# Patient Record
Sex: Female | Born: 1990 | Race: Black or African American | Hispanic: No | Marital: Single | Smoking: Never smoker
Health system: Southern US, Community
[De-identification: ages and names within clinical notes are randomized; demographics above are authoritative.]

## PROBLEM LIST (undated history)

## (undated) DIAGNOSIS — F32A Depression, unspecified: Secondary | ICD-10-CM

## (undated) DIAGNOSIS — F419 Anxiety disorder, unspecified: Secondary | ICD-10-CM

## (undated) HISTORY — DX: Depression, unspecified: F32.A

## (undated) HISTORY — PX: WISDOM TOOTH EXTRACTION: SHX21

## (undated) HISTORY — PX: ADENOIDECTOMY: SHX5191

## (undated) HISTORY — DX: Anxiety disorder, unspecified: F41.9

## (undated) HISTORY — PX: TONSILLECTOMY AND ADENOIDECTOMY: SHX28

---
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
## 2018-12-15 ENCOUNTER — Ambulatory Visit (HOSPITAL_COMMUNITY)
Admission: EM | Admit: 2018-12-15 | Discharge: 2018-12-15 | Disposition: A | Discharge status: Home / Self Care | Payer: BLUE CROSS/BLUE SHIELD | Attending: Family Medicine | Admitting: Family Medicine

## 2018-12-15 ENCOUNTER — Other Ambulatory Visit: Payer: Self-pay

## 2018-12-15 ENCOUNTER — Encounter (HOSPITAL_COMMUNITY): Payer: Self-pay | Admitting: Emergency Medicine

## 2018-12-15 DIAGNOSIS — G44209 Tension-type headache, unspecified, not intractable: Secondary | ICD-10-CM | POA: Diagnosis not present

## 2018-12-15 MED ORDER — KETOROLAC TROMETHAMINE 60 MG/2ML IM SOLN
INTRAMUSCULAR | Status: AC
  Filled 2018-12-15: qty 2

## 2018-12-15 MED ORDER — KETOROLAC TROMETHAMINE 60 MG/2ML IM SOLN
60.0000 mg | Freq: Once | INTRAMUSCULAR | Status: AC
  Administered 2018-12-15: 60 mg via INTRAMUSCULAR

## 2018-12-15 NOTE — ED Notes (Signed)
Bed: UC05 Expected date: 12/15/18 Expected time: 4:51 PM Means of arrival:  Comments: PATIENT

## 2018-12-15 NOTE — ED Triage Notes (Signed)
Headache, history of high blood pressure

## 2018-12-15 NOTE — ED Provider Notes (Signed)
[facility]   [id number] 12/15/18 Arrival Time: 1652  ASSESSMENT & PLAN:  1. Tension headache    BP normal here. Reassured. Unlikely headache related to BP.  Normal neurological exam. No indication for neurodiagnostic workup at this time. Discussed.   Meds ordered this encounter  Medications  . ketorolac (TORADOL) injection 60 mg   Follow-up Information    [facility] EMERGENCY DEPARTMENT.   Specialty:  Emergency Medicine Why:  If symptoms worsen. Contact information: [address] [id number] mc [place] [address] [phone]         Reviewed expectations re: course of current medical issues. Questions answered. Outlined signs and symptoms indicating need for more acute intervention. Patient verbalized understanding. After Visit Summary given.   SUBJECTIVE:  AVERLEIGH BIRCH is a 28 y.o. female who presents with complaint of a headache. Reports gradual over the past week; on/off. Location: posterior neck to superior scalp. "Feels tight." History of headaches: reports occasional migraines; this different. Not the worst headache of her life. Ibuprofen with mild relief. Does not wake her at night. Precipitating factors include: none which have been determined. Associated symptoms: Preceding aura: no. Nausea/vomiting: no. Vision changes: no. Increased sensitivity to light and to noises: no. Fever: no. Sinus pressure/congestion: no. Extremity weakness: no. Current headache has not limited normal daily activities. Denies dizziness, loss of balance, muscle weakness, numbness of extremities, speech difficulties and vision problems. No head injury reported. Ambulatory without difficulty.  ROS: As per HPI. All other systems negative.    OBJECTIVE:  Vitals:   12/15/18 1719 12/15/18 1720  BP: 100/68   Pulse: 64   Resp: (!) 1 18  Temp: 98.7 F (37.1 C)   TempSrc: Oral   SpO2: 96%     General appearance: alert;  no distress Eyes: PERRLA; EOMI; conjunctiva normal HENT: normocephalic; atraumatic Neck: supple with FROM Lungs: clear to auscultation bilaterally Heart: regular rate and rhythm Extremities: no edema; symmetrical with no gross deformities Skin: warm and dry Neurologic: CN 2-12 grossly intact; normal gait; normal symmetric reflexes; normal extremity strength and sensation throughout Psychological: alert and cooperative; normal mood and affect  No Known Allergies  PMH: H/O headaches.  Social History   Socioeconomic History  . Marital status: Single    Spouse name: Not on file  . Number of children: Not on file  . Years of education: Not on file  . Highest education level: Not on file  Occupational History  . Not on file  Social Needs  . Financial resource strain: Not on file  . Food insecurity:    Worry: Not on file    Inability: Not on file  . Transportation needs:    Medical: Not on file    Non-medical: Not on file  Tobacco Use  . Smoking status: Never Smoker  Substance and Sexual Activity  . Alcohol use: Never    Frequency: Never  . Drug use: Never  . Sexual activity: Not on file  Lifestyle  . Physical activity:    Days per week: Not on file    Minutes per session: Not on file  . Stress: Not on file  Relationships  . Social connections:    Talks on phone: Not on file    Gets together: Not on file    Attends religious service: Not on file    Active member of club or organization: Not on file    Attends meetings of clubs or organizations: Not on file  Relationship status: Not on file  . Intimate partner violence:    Fear of current or ex partner: Not on file    Emotionally abused: Not on file    Physically abused: Not on file    Forced sexual activity: Not on file  Other Topics Concern  . Not on file  Social History Narrative  . Not on file   FH: Questions h/o migraines.  History reviewed. No pertinent surgical history.   Mardella Layman, MD 12/23/18  [phone]

## 2020-06-15 ENCOUNTER — Ambulatory Visit (HOSPITAL_COMMUNITY)
Admission: EM | Admit: 2020-06-15 | Discharge: 2020-06-15 | Disposition: A | Discharge status: Home / Self Care | Payer: BC Managed Care – PPO | Attending: Internal Medicine | Admitting: Internal Medicine

## 2020-06-15 ENCOUNTER — Encounter (HOSPITAL_COMMUNITY): Payer: Self-pay | Admitting: Emergency Medicine

## 2020-06-15 ENCOUNTER — Other Ambulatory Visit: Payer: Self-pay

## 2020-06-15 DIAGNOSIS — J040 Acute laryngitis: Secondary | ICD-10-CM | POA: Diagnosis not present

## 2020-06-15 DIAGNOSIS — Z20822 Contact with and (suspected) exposure to covid-19: Secondary | ICD-10-CM | POA: Diagnosis not present

## 2020-06-15 MED ORDER — FLUTICASONE PROPIONATE 50 MCG/ACT NA SUSP: 1.0000 | Freq: Every day | NASAL | 0 refills | Status: DC

## 2020-06-15 NOTE — Discharge Instructions (Signed)
If your symptoms worsen please return to urgent care for re-evaluation

## 2020-06-15 NOTE — ED Notes (Signed)
covid sample collected, liquid in vial, lid secured tightly, label placed, placed in lab

## 2020-06-15 NOTE — ED Triage Notes (Signed)
Reports having sore throat, sinus pressure, congestion, nasal congestion-this started on Monday.  Now patient has a cough that developed on Tuesday.  Minimal phlegm with coughing.

## 2020-06-16 LAB — SARS CORONAVIRUS 2 (TAT 6-24 HRS): SARS Coronavirus 2: NEGATIVE

## 2020-06-18 NOTE — ED Provider Notes (Signed)
[facility]    CSN: [id number] Arrival date & time: 06/15/20  1652      History   Chief Complaint Chief Complaint  Patient presents with  . Cough    HPI Brooke Dean is a 29 y.o. female comes to the urgent care with complaints of sinus pressure and congestion which started on Monday patient says symptoms started insidiously and has progressed.  Currently has coughing which is mainly nonproductive.  No fever or chills.  No loss of taste and smell.   No nausea vomiting or diarrhea.  Patient has noticed hoarseness in the voice with no difficulty swallowing.  No choking sensation.  No shortness of breath, wheezing or chest pain.  Patient has some sinus pressure, no headaches  HPI  History reviewed. No pertinent past medical history.  There are no problems to display for this patient.   Past Surgical History:  Procedure Laterality Date  . TONSILLECTOMY AND ADENOIDECTOMY    . WISDOM TOOTH EXTRACTION      OB History   No obstetric history on file.      Home Medications    Prior to Admission medications   Medication Sig Start Date End Date Taking? Authorizing Provider  fluticasone (FLONASE) 50 MCG/ACT nasal spray Place 1 spray into both nostrils daily. 06/15/20   Saylee Sherrill, Britta Mccreedy, MD    Family History No family history on file.  Social History Social History   Tobacco Use  . Smoking status: Never Smoker  Substance Use Topics  . Alcohol use: Yes  . Drug use: Never    Comment: thc gummies     Allergies   Other   Review of Systems Review of Systems  Constitutional: Negative.   HENT: Positive for congestion, postnasal drip, rhinorrhea, sinus pressure and sinus pain. Negative for sore throat.   Gastrointestinal: Negative.   Genitourinary: Negative.   Neurological: Negative.      Physical Exam Triage Vital Signs ED Triage Vitals  Enc Vitals Group     BP 06/15/20 1844 (!) 132/71     Pulse Rate 06/15/20 1844 81     Resp 06/15/20 1844 18      Temp 06/15/20 1844 98.5 F (36.9 C)     Temp Source 06/15/20 1844 Oral     SpO2 06/15/20 1844 96 %     Weight --      Height --      Head Circumference --      Peak Flow --      Pain Score 06/15/20 1840 0     Pain Loc --      Pain Edu? --      Excl. in GC? --    No data found.  Updated Vital Signs BP (!) 132/71 (BP Location: Right Arm)   Pulse 81   Temp 98.5 F (36.9 C) (Oral)   Resp 18   SpO2 96%   Visual Acuity Right Eye Distance:   Left Eye Distance:   Bilateral Distance:    Right Eye Near:   Left Eye Near:    Bilateral Near:     Physical Exam Constitutional:      General: She is not in acute distress.    Appearance: She is not ill-appearing.  Cardiovascular:     Rate and Rhythm: Normal rate and regular rhythm.     Pulses: Normal pulses.     Heart sounds: Normal heart sounds.  Pulmonary:     Effort: Pulmonary effort is normal.  Breath sounds: Normal breath sounds.  Musculoskeletal:        General: Normal range of motion.  Neurological:     Mental Status: She is alert.      UC Treatments / Results  Labs (all labs ordered are listed, but only abnormal results are displayed) Labs Reviewed  SARS CORONAVIRUS 2 (TAT 6-24 HRS)    EKG   Radiology No results found.  Procedures Procedures (including critical care time)  Medications Ordered in UC Medications - No data to display  Initial Impression / Assessment and Plan / UC Course  I have reviewed the triage vital signs and the nursing notes.  Pertinent labs & imaging results that were available during my care of the patient were reviewed by me and considered in my medical decision making (see chart for details).     1.  Acute laryngitis: Supportive care with fluticasone nasal spray Warm salt water gargles Humidifier use Return precautions given  Final Clinical Impressions(s) / UC Diagnoses   Final diagnoses:  Acute laryngitis     Discharge Instructions     If your symptoms  worsen please return to urgent care for re-evaluation   ED Prescriptions    Medication Sig Dispense Auth. Provider   fluticasone (FLONASE) 50 MCG/ACT nasal spray Place 1 spray into both nostrils daily. 16 g Kersten Salmons, Britta Mccreedy, MD     PDMP not reviewed this encounter.   Merrilee Jansky, MD 06/18/20 [phone]

## 2020-07-06 ENCOUNTER — Ambulatory Visit (INDEPENDENT_AMBULATORY_CARE_PROVIDER_SITE_OTHER): Payer: BC Managed Care – PPO

## 2020-07-06 ENCOUNTER — Other Ambulatory Visit: Payer: Self-pay

## 2020-07-06 ENCOUNTER — Ambulatory Visit: Payer: BC Managed Care – PPO | Admitting: Podiatry

## 2020-07-06 ENCOUNTER — Encounter: Payer: Self-pay | Admitting: Podiatry

## 2020-07-06 DIAGNOSIS — S9031XA Contusion of right foot, initial encounter: Secondary | ICD-10-CM

## 2020-07-06 DIAGNOSIS — M7661 Achilles tendinitis, right leg: Secondary | ICD-10-CM

## 2020-07-06 DIAGNOSIS — M2141 Flat foot [pes planus] (acquired), right foot: Secondary | ICD-10-CM

## 2020-07-06 DIAGNOSIS — M2142 Flat foot [pes planus] (acquired), left foot: Secondary | ICD-10-CM | POA: Diagnosis not present

## 2020-07-06 MED ORDER — METHYLPREDNISOLONE 4 MG PO TBPK: ORAL_TABLET | ORAL | 0 refills | Status: DC

## 2020-07-06 NOTE — Patient Instructions (Signed)
If was nice to meet you today. If you have any questions or any further concerns, please feel fee to give me a call. You can call our office at [phone] or please feel fee to send me a message through MyChart.    Achilles Tendinitis  with Rehab Achilles tendinitis is a disorder of the Achilles tendon. The Achilles tendon connects the large calf muscles (Gastrocnemius and Soleus) to the heel bone (calcaneus). This tendon is sometimes called the heel cord. It is important for pushing-off and standing on your toes and is important for walking, running, or jumping. Tendinitis is often caused by overuse and repetitive microtrauma. SYMPTOMS  Pain, tenderness, swelling, warmth, and redness may occur over the Achilles tendon even at rest.  Pain with pushing off, or flexing or extending the ankle.  Pain that is worsened after or during activity. CAUSES   Overuse sometimes seen with rapid increase in exercise programs or in sports requiring running and jumping.  Poor physical conditioning (strength and flexibility or endurance).  Running sports, especially training running down hills.  Inadequate warm-up before practice or play or failure to stretch before participation.  Injury to the tendon. PREVENTION   Warm up and stretch before practice or competition.  Allow time for adequate rest and recovery between practices and competition.  Keep up conditioning.  Keep up ankle and leg flexibility.  Improve or keep muscle strength and endurance.  Improve cardiovascular fitness.  Use proper technique.  Use proper equipment (shoes, skates).  To help prevent recurrence, taping, protective strapping, or an adhesive bandage may be recommended for several weeks after healing is complete. PROGNOSIS   Recovery may take weeks to several months to heal.  Longer recovery is expected if symptoms have been prolonged.  Recovery is usually quicker if the inflammation is due to a direct blow as  compared with overuse or sudden strain. RELATED COMPLICATIONS   Healing time will be prolonged if the condition is not correctly treated. The injury must be given plenty of time to heal.  Symptoms can reoccur if activity is resumed too soon.  Untreated, tendinitis may increase the risk of tendon rupture requiring additional time for recovery and possibly surgery. TREATMENT   The first treatment consists of rest anti-inflammatory medication, and ice to relieve the pain.  Stretching and strengthening exercises after resolution of pain will likely help reduce the risk of recurrence. Referral to a physical therapist or athletic trainer for further evaluation and treatment may be helpful.  A walking boot or cast may be recommended to rest the Achilles tendon. This can help break the cycle of inflammation and microtrauma.  Arch supports (orthotics) may be prescribed or recommended by your caregiver as an adjunct to therapy and rest.  Surgery to remove the inflamed tendon lining or degenerated tendon tissue is rarely necessary and has shown less than predictable results. MEDICATION   Nonsteroidal anti-inflammatory medications, such as aspirin and ibuprofen, may be used for pain and inflammation relief. Do not take within 7 days before surgery. Take these as directed by your caregiver. Contact your caregiver immediately if any bleeding, stomach upset, or signs of allergic reaction occur. Other minor pain relievers, such as acetaminophen, may also be used.  Pain relievers may be prescribed as necessary by your caregiver. Do not take prescription pain medication for longer than 4 to 7 days. Use only as directed and only as much as you need.  Cortisone injections are rarely indicated. Cortisone injections may weaken tendons and predispose  to rupture. It is better to give the condition more time to heal than to use them. HEAT AND COLD  Cold is used to relieve pain and reduce inflammation for acute  and chronic Achilles tendinitis. Cold should be applied for 10 to 15 minutes every 2 to 3 hours for inflammation and pain and immediately after any activity that aggravates your symptoms. Use ice packs or an ice massage.  Heat may be used before performing stretching and strengthening activities prescribed by your caregiver. Use a heat pack or a warm soak. SEEK MEDICAL CARE IF:  Symptoms get worse or do not improve in 2 weeks despite treatment.  New, unexplained symptoms develop. Drugs used in treatment may produce side effects.  EXERCISES:  RANGE OF MOTION (ROM) AND STRETCHING EXERCISES - Achilles Tendinitis  These exercises may help you when beginning to rehabilitate your injury. Your symptoms may resolve with or without further involvement from your physician, physical therapist or athletic trainer. While completing these exercises, remember:   Restoring tissue flexibility helps normal motion to return to the joints. This allows healthier, less painful movement and activity.  An effective stretch should be held for at least 30 seconds.  A stretch should never be painful. You should only feel a gentle lengthening or release in the stretched tissue.  STRETCH  Gastroc, Standing   Place hands on wall.  Extend right / left leg, keeping the front knee somewhat bent.  Slightly point your toes inward on your back foot.  Keeping your right / left heel on the floor and your knee straight, shift your weight toward the wall, not allowing your back to arch.  You should feel a gentle stretch in the right / left calf. Hold this position for 10 seconds. Repeat 3 times. Complete this stretch 2 times per day.  STRETCH  Soleus, Standing   Place hands on wall.  Extend right / left leg, keeping the other knee somewhat bent.  Slightly point your toes inward on your back foot.  Keep your right / left heel on the floor, bend your back knee, and slightly shift your weight over the back leg so that  you feel a gentle stretch deep in your back calf.  Hold this position for 10 seconds. Repeat 3 times. Complete this stretch 2 times per day.  STRETCH  Gastrocsoleus, Standing  Note: This exercise can place a lot of stress on your foot and ankle. Please complete this exercise only if specifically instructed by your caregiver.   Place the ball of your right / left foot on a step, keeping your other foot firmly on the same step.  Hold on to the wall or a rail for balance.  Slowly lift your other foot, allowing your body weight to press your heel down over the edge of the step.  You should feel a stretch in your right / left calf.  Hold this position for 10 seconds.  Repeat this exercise with a slight bend in your knee. Repeat 3 times. Complete this stretch 2 times per day.   STRENGTHENING EXERCISES - Achilles Tendinitis These exercises may help you when beginning to rehabilitate your injury. They may resolve your symptoms with or without further involvement from your physician, physical therapist or athletic trainer. While completing these exercises, remember:   Muscles can gain both the endurance and the strength needed for everyday activities through controlled exercises.  Complete these exercises as instructed by your physician, physical therapist or athletic trainer. Progress the resistance  and repetitions only as guided.  You may experience muscle soreness or fatigue, but the pain or discomfort you are trying to eliminate should never worsen during these exercises. If this pain does worsen, stop and make certain you are following the directions exactly. If the pain is still present after adjustments, discontinue the exercise until you can discuss the trouble with your clinician.  STRENGTH - Plantar-flexors   Sit with your right / left leg extended. Holding onto both ends of a rubber exercise band/tubing, loop it around the ball of your foot. Keep a slight tension in the  band.  Slowly push your toes away from you, pointing them downward.  Hold this position for 10 seconds. Return slowly, controlling the tension in the band/tubing. Repeat 3 times. Complete this exercise 2 times per day.   STRENGTH - Plantar-flexors   Stand with your feet shoulder width apart. Steady yourself with a wall or table using as little support as needed.  Keeping your weight evenly spread over the width of your feet, rise up on your toes.*  Hold this position for 10 seconds. Repeat 3 times. Complete this exercise 2 times per day.  *If this is too easy, shift your weight toward your right / left leg until you feel challenged. Ultimately, you may be asked to do this exercise with your right / left foot only.  STRENGTH  Plantar-flexors, Eccentric  Note: This exercise can place a lot of stress on your foot and ankle. Please complete this exercise only if specifically instructed by your caregiver.   Place the balls of your feet on a step. With your hands, use only enough support from a wall or rail to keep your balance.  Keep your knees straight and rise up on your toes.  Slowly shift your weight entirely to your right / left toes and pick up your opposite foot. Gently and with controlled movement, lower your weight through your right / left foot so that your heel drops below the level of the step. You will feel a slight stretch in the back of your calf at the end position.  Use the healthy leg to help rise up onto the balls of both feet, then lower weight only on the right / left leg again. Build up to 15 repetitions. Then progress to 3 consecutive sets of 15 repetitions.*  After completing the above exercise, complete the same exercise with a slight knee bend (about 30 degrees). Again, build up to 15 repetitions. Then progress to 3 consecutive sets of 15 repetitions.* Perform this exercise 2 times per day.  *When you easily complete 3 sets of 15, your physician, physical therapist  or athletic trainer may advise you to add resistance by wearing a backpack filled with additional weight.  STRENGTH - Plantar Flexors, Seated   Sit on a chair that allows your feet to rest flat on the ground. If necessary, sit at the edge of the chair.  Keeping your toes firmly on the ground, lift your right / left heel as far as you can without increasing any discomfort in your ankle. Repeat 3 times. Complete this exercise 2 times a day.

## 2020-07-10 NOTE — Progress Notes (Signed)
Subjective:   Patient ID: Brooke Dean, female   DOB: 29 y.o.   MRN: [id number]   HPI 29 year old female presents the office today for concerns of Achilles tendon discomfort.  She states that on June 19, 2020 she felt a sharp pain in the back of her leg along the Achilles tendon but she denies any falls or injury.  She does work at [employer] standing on concrete surfaces which may seem to aggravate her symptoms.  She states that people tell her she is walking with a limp.  No specific fall or injury.  No increase in swelling or bruising.  Also is concerned of nail fungus.  No pain in the nails and no recent treatment.   Review of Systems  All other systems reviewed and are negative.  No past medical history on file.  Past Surgical History:  Procedure Laterality Date  . TONSILLECTOMY AND ADENOIDECTOMY    . WISDOM TOOTH EXTRACTION       Current Outpatient Medications:  .  fluticasone (FLONASE) 50 MCG/ACT nasal spray, Place 1 spray into both nostrils daily. (Patient not taking: Reported on 07/06/2020), Disp: 16 g, Rfl: 0 .  methylPREDNISolone (MEDROL DOSEPAK) 4 MG TBPK tablet, Take as directed, Disp: 21 tablet, Rfl: 0  Allergies  Allergen Reactions  . Other Swelling        Objective:  Physical Exam  General: AAO x3, NAD  Dermatological: Nails are mildly hypertrophic, dystrophic and yellow to brown discoloration.  No hyperpigmented changes.  No open lesions.  Vascular: Dorsalis Pedis artery and Posterior Tibial artery pedal pulses are 2/4 bilateral with immedate capillary fill time. There is no pain with calf compression, swelling, warmth, erythema.   Neruologic: Grossly intact via light touch bilateral.  Musculoskeletal: Mild tenderness palpation on the course of the Achilles tendon but Thompson test is negative and I am able to palpate the borders of the Achilles tendon.  There is mild edema there is no erythema or warmth.  No pain about compression of calcaneus.  No pain to  the plantar calcaneus or other areas of the foot.  Muscular strength 5/5 in all groups tested bilateral.  Flatfoot is present  Gait: Unassisted, Nonantalgic.       Assessment:   29 year old female Achilles tendinitis     Plan:  -Treatment options discussed including all alternatives, risks, and complications -Etiology of symptoms were discussed -X-rays were obtained and reviewed with the patient.  No evidence of acute fracture or stress fracture identified today. -Given her symptoms I recommend immobilization in cam boot which is dispensed today.  Medrol Dosepak.  Ice as well daily.  As she starts to feel better discussed stretching, rehab exercises to be performed at home but we can also consider formal physical therapy.  Long-term she needs orthotics. -Penlac for nail fungus  Vivi Barrack DPM

## 2020-07-12 ENCOUNTER — Telehealth: Payer: Self-pay

## 2020-07-12 ENCOUNTER — Other Ambulatory Visit: Payer: Self-pay | Admitting: Podiatry

## 2020-07-12 MED ORDER — CICLOPIROX 8 % EX SOLN: Freq: Every day | CUTANEOUS | 4 refills | Status: DC

## 2020-07-12 NOTE — Telephone Encounter (Signed)
Thank you :)

## 2020-07-12 NOTE — Telephone Encounter (Signed)
resent

## 2020-07-12 NOTE — Telephone Encounter (Signed)
Pt stated that she went to [facility] for her fungal medication and they didn't now have it. Please advise.

## 2020-07-13 ENCOUNTER — Ambulatory Visit: Payer: BC Managed Care – PPO | Admitting: Orthotics

## 2020-07-13 ENCOUNTER — Other Ambulatory Visit: Payer: Self-pay

## 2020-07-13 DIAGNOSIS — M7661 Achilles tendinitis, right leg: Secondary | ICD-10-CM

## 2020-07-13 DIAGNOSIS — M2141 Flat foot [pes planus] (acquired), right foot: Secondary | ICD-10-CM

## 2020-07-13 DIAGNOSIS — M2142 Flat foot [pes planus] (acquired), left foot: Secondary | ICD-10-CM | POA: Diagnosis not present

## 2020-07-13 NOTE — Progress Notes (Signed)
Cast today for CMFO to addess achilles tenonditis, plan on 1/4" lift b/l on heel.

## 2020-08-03 ENCOUNTER — Other Ambulatory Visit: Payer: Self-pay

## 2020-08-03 ENCOUNTER — Ambulatory Visit: Payer: BC Managed Care – PPO | Admitting: Podiatry

## 2020-08-03 ENCOUNTER — Encounter: Payer: Self-pay | Admitting: Podiatry

## 2020-08-03 DIAGNOSIS — M2142 Flat foot [pes planus] (acquired), left foot: Secondary | ICD-10-CM

## 2020-08-03 DIAGNOSIS — M2141 Flat foot [pes planus] (acquired), right foot: Secondary | ICD-10-CM

## 2020-08-03 DIAGNOSIS — M7661 Achilles tendinitis, right leg: Secondary | ICD-10-CM

## 2020-08-03 MED ORDER — MELOXICAM 15 MG PO TABS: 15.0000 mg | ORAL_TABLET | Freq: Every day | ORAL | 0 refills | Status: DC

## 2020-08-03 NOTE — Patient Instructions (Signed)

## 2020-08-06 NOTE — Progress Notes (Signed)
Subjective: 29 year old female presents the office for Evaluation of right Achilles tendinitis.  She states that she is doing better although she still having some discomfort.  She is not able to wear the cam boot at work.  She worked at other times however.  She was feeling drowsy on the medication (Medrol Dosepak) denies any systemic complaints such as fevers, chills, nausea, vomiting. No acute changes since last appointment, and no other complaints at this time.   Objective: AAO x3, NAD DP/PT pulses palpable bilaterally, CRT less than 3 seconds There still tenderness palpation along the distal portion Achilles tendon insertion of the calcaneus and Aquinas is evident.  There is no pain with lateral compression of calcaneus there is no edema, erythema.  Plantar fascia appears intact as well.  No other areas of discomfort identified.  MMT 5/5. Flatfoot present No pain with calf compression, swelling, warmth, erythema  Assessment: Right Achilles tendinitis, equinus  Plan: -All treatment options discussed with the patient including all alternatives, risks, complications.  -At this point will refer to physical therapy and order was placed today.  This was put in for [facility] physical therapy. -She is not able to wear the boot at work.  Restrictions were made at that point she can still work on the floor. -Prescribed mobic. Discussed side effects of the medication and directed to stop if any are to occur and call the office.  -Discussed shoe modifications and orthotics -Patient encouraged to call the office with any questions, concerns, change in symptoms.   Vivi Barrack DPM

## 2020-08-09 ENCOUNTER — Telehealth: Payer: Self-pay | Admitting: Podiatry

## 2020-08-09 NOTE — Telephone Encounter (Signed)
Patient is in Physical Therapy and has boot on, would like handicap card.

## 2020-08-10 ENCOUNTER — Telehealth: Payer: Self-pay | Admitting: Podiatry

## 2020-08-10 ENCOUNTER — Other Ambulatory Visit: Payer: Self-pay

## 2020-08-10 ENCOUNTER — Ambulatory Visit: Payer: BC Managed Care – PPO | Admitting: Orthotics

## 2020-08-10 DIAGNOSIS — M7661 Achilles tendinitis, right leg: Secondary | ICD-10-CM

## 2020-08-10 DIAGNOSIS — M2142 Flat foot [pes planus] (acquired), left foot: Secondary | ICD-10-CM

## 2020-08-10 NOTE — Progress Notes (Signed)
Patient came in today to pick up custom made foot orthotics.  The goals were accomplished and the patient reported no dissatisfaction with said orthotics.  Patient was advised of breakin period and how to report any issues. 

## 2020-08-10 NOTE — Telephone Encounter (Signed)
Pt needed referral to PT- prescription written and I gave it to PT myself

## 2020-08-10 NOTE — Telephone Encounter (Signed)
That is OK to do 2 months

## 2020-08-24 ENCOUNTER — Ambulatory Visit: Payer: BC Managed Care – PPO | Admitting: Podiatry

## 2020-08-24 ENCOUNTER — Encounter: Payer: Self-pay | Admitting: Podiatry

## 2020-08-24 ENCOUNTER — Ambulatory Visit: Payer: BC Managed Care – PPO | Admitting: Orthotics

## 2020-08-24 ENCOUNTER — Other Ambulatory Visit: Payer: Self-pay

## 2020-08-24 DIAGNOSIS — M2142 Flat foot [pes planus] (acquired), left foot: Secondary | ICD-10-CM | POA: Diagnosis not present

## 2020-08-24 DIAGNOSIS — M2141 Flat foot [pes planus] (acquired), right foot: Secondary | ICD-10-CM | POA: Diagnosis not present

## 2020-08-24 DIAGNOSIS — M7661 Achilles tendinitis, right leg: Secondary | ICD-10-CM | POA: Diagnosis not present

## 2020-08-24 NOTE — Progress Notes (Signed)
No adjustments needed, she bought a larger shoe

## 2020-08-26 ENCOUNTER — Other Ambulatory Visit: Payer: Self-pay | Admitting: Podiatry

## 2020-08-29 NOTE — Progress Notes (Signed)
Subjective: 29 year old female presents the office for Evaluation of right Achilles tendinitis.  Overall she states that she is feeling better she still doing physical therapy.  She presents today still wearing the cam boot.  No worse injury or falls or changes since I last saw her.  Objective: AAO x3, NAD DP/PT pulses palpable bilaterally, CRT less than 3 seconds There still mild however improved tenderness palpation along the distal portion Achilles tendon insertion of the calcaneus and Aquinas is present.  There is no pain with lateral compression of calcaneus there is no edema, erythema.  Plantar fascia appears intact as well.  No other areas of discomfort identified.  MMT 5/5. Flatfoot present No pain with calf compression, swelling, warmth, erythema  Assessment: Right Achilles tendinitis, equinus  Plan: -All treatment options discussed with the patient including all alternatives, risks, complications.  -Continue physical therapy.  She can transition back to regular shoe as tolerated.  She presents today for possible orthotic adjustments.  Continue with supportive shoes. -Work note provided today for her to continue to work on the floor.  Vivi Barrack DPM

## 2020-09-21 ENCOUNTER — Other Ambulatory Visit: Payer: Self-pay | Admitting: Podiatry

## 2020-09-27 NOTE — Telephone Encounter (Signed)
Please advise 

## 2020-10-09 ENCOUNTER — Encounter: Payer: Self-pay | Admitting: Podiatry

## 2020-10-09 ENCOUNTER — Ambulatory Visit: Payer: BC Managed Care – PPO | Admitting: Podiatry

## 2020-10-09 ENCOUNTER — Other Ambulatory Visit: Payer: Self-pay

## 2020-10-09 DIAGNOSIS — M2142 Flat foot [pes planus] (acquired), left foot: Secondary | ICD-10-CM

## 2020-10-09 DIAGNOSIS — M7661 Achilles tendinitis, right leg: Secondary | ICD-10-CM | POA: Diagnosis not present

## 2020-10-09 DIAGNOSIS — M2141 Flat foot [pes planus] (acquired), right foot: Secondary | ICD-10-CM | POA: Diagnosis not present

## 2020-10-16 NOTE — Progress Notes (Signed)
Subjective: 29 year old female presents the office for follow-up evaluation of right Achilles tendinitis/heel pain.  She states the inserts are doing great.  She has occasional discomfort in the bottom of her left heel after sitting for long period of time but otherwise has been improving.  She states that there is more complications of the doing well for her as well and out of the discomfort.  She is asking this can be extended.  She still doing physical therapy which is been helpful.  Objective: AAO x3, NAD DP/PT pulses palpable bilaterally, CRT less than 3 seconds There is currently no significant discomfort to tenderness palpation along the distal portion Achilles tendon insertion of the calcaneus and equinus is present.  There is no pain with lateral compression of calcaneus there is no edema, erythema.  Plantar fascia appears intact as well however the mild discomfort the plantar aspect the calcaneus at times..  No other areas of discomfort identified.  MMT 5/5. Flatfoot present No pain with calf compression, swelling, warmth, erythema  Assessment: Right Achilles tendinitis, equinus; plan fasciitis  Plan: -All treatment options discussed with the patient including all alternatives, risks, complications.  -Continue physical therapy.  Continue with supportive shoes, orthotics.  She states the making good progress program to continue with modifications for right now.  Given her flatfoot she consider surgical invention at future if needed at the best likely contributor to her symptoms but for now and continue conservative treatment.  Vivi Barrack DPM

## 2020-10-21 ENCOUNTER — Other Ambulatory Visit: Payer: Self-pay | Admitting: Podiatry

## 2020-10-23 NOTE — Telephone Encounter (Signed)
Please advise 

## 2020-11-20 ENCOUNTER — Ambulatory Visit: Payer: BC Managed Care – PPO | Admitting: Podiatry

## 2020-11-23 ENCOUNTER — Other Ambulatory Visit: Payer: Self-pay

## 2020-11-23 ENCOUNTER — Ambulatory Visit: Payer: BC Managed Care – PPO | Admitting: Podiatry

## 2020-11-23 DIAGNOSIS — M2141 Flat foot [pes planus] (acquired), right foot: Secondary | ICD-10-CM | POA: Diagnosis not present

## 2020-11-23 DIAGNOSIS — M7661 Achilles tendinitis, right leg: Secondary | ICD-10-CM | POA: Diagnosis not present

## 2020-11-23 DIAGNOSIS — S6990XA Unspecified injury of unspecified wrist, hand and finger(s), initial encounter: Secondary | ICD-10-CM

## 2020-11-23 DIAGNOSIS — M2142 Flat foot [pes planus] (acquired), left foot: Secondary | ICD-10-CM

## 2020-11-25 ENCOUNTER — Other Ambulatory Visit: Payer: Self-pay | Admitting: Podiatry

## 2020-11-26 NOTE — Telephone Encounter (Signed)
Please advise 

## 2020-11-28 NOTE — Progress Notes (Signed)
Subjective: 30 year old female presents the office for follow-up evaluation of right Achilles tendinitis/heel pain.  She states that she is feeling "a lot better".  She is still doing physical therapy which has been helpful and she is also to work on changing shoes to give better support.  Her foot is feeling much better but she feels that her hips are tight.  She recently went to her pelvic rehab specialist which has been helping as well.  Today her other Main concern is she injured her right thumb with continued pain she is asking for referral to orthopedics.  No recent lower extremity injury.  Objective: AAO x3, NAD DP/PT pulses palpable bilaterally, CRT less than 3 seconds There is significant tenderness to palpation the course of insertion of the Achilles tendon.  The Achilles tendon appears to be intact.  There is no pain with lateral compression of the calcaneus.  No pain in the plantar fashion today.  Decreased medial arch upon weightbearing.  No erythema point tenderness.  MMT 5/5. Flatfoot present No pain with calf compression, swelling, warmth, erythema  Assessment: Right Achilles tendinitis, equinus  Plan: -All treatment options discussed with the patient including all alternatives, risks, complications.  -Overall she is continuing to improve and feels much better.  We will finish physical therapy and discussed again today wearing better shoes, arch supports. -Referral to orthopedics for her thumb placed  Return in about 2 months (around 01/21/2021), or if symptoms worsen or fail to improve.  Vivi Barrack DPM

## 2020-12-08 ENCOUNTER — Other Ambulatory Visit: Payer: Self-pay

## 2020-12-08 ENCOUNTER — Ambulatory Visit (INDEPENDENT_AMBULATORY_CARE_PROVIDER_SITE_OTHER): Payer: BC Managed Care – PPO | Admitting: Orthopaedic Surgery

## 2020-12-08 ENCOUNTER — Ambulatory Visit: Payer: Self-pay

## 2020-12-08 ENCOUNTER — Encounter: Payer: Self-pay | Admitting: Orthopaedic Surgery

## 2020-12-08 VITALS — BP 126/75 | HR 71 | Ht 68.0 in | Wt 298.0 lb

## 2020-12-08 DIAGNOSIS — M79645 Pain in left finger(s): Secondary | ICD-10-CM

## 2020-12-08 MED ORDER — MELOXICAM 15 MG PO TABS: 15.0000 mg | ORAL_TABLET | Freq: Every day | ORAL | 0 refills | Status: DC

## 2020-12-08 NOTE — Progress Notes (Signed)
Office Visit Note   Patient: Brooke Dean           Date of Birth: 18-Sep-1991           MRN: [id number] Visit Date: 12/08/2020              Requested by: Vivi Barrack, DPM [address] [place],  [place] [id number] PCP: Patient, No Pcp Per   Assessment & Plan: Visit Diagnoses:  1. Pain of left thumb     Plan: We will place patient in radial gutter splint.  I renewed her meloxicam that she felt was helping with the swelling and discomfort.  We discussed she sprained her MP joint and this should gradually improve with time she can wear the brace is much as she is able and she understands that with her thumb immobilized there are certain activities that she uses with her hand that would be difficult to perform.  I will check her back again in 4 weeks.  No x-ray needed on return. Follow-Up Instructions: Return in about 4 weeks (around 01/05/2021).   Orders:  Orders Placed This Encounter  Procedures   XR Finger Thumb Left   Meds ordered this encounter  Medications   meloxicam (MOBIC) 15 MG tablet    Sig: Take 1 tablet (15 mg total) by mouth daily.    Dispense:  30 tablet    Refill:  0      Procedures: No procedures performed   Clinical Data: No additional findings.   Subjective: Chief Complaint  Patient presents with   Left Thumb - Pain    HPI 30 year old female works at [employer] and was trying to get something off of belt that was jammed and thinks she may have jammed her thumb a few weeks ago.  She had previous x-rays done at her PCP and placed on meloxicam.  Patient works in a warehouse.  Patient's had some pain with grasping and points to the MP joint of the thumb where she is having pain.  She is right-hand dominant.  She denies locking.  No tenderness or complaints in the first dorsal compartment region.  She denies associated neck pain.  Patient had previous injury to her opposite right small finger but has good flexion and extension  of the finger currently.   Review of Systems all other systems are noncontributory to HPI.   Objective: Vital Signs: BP 126/75    Pulse 71    Ht 5\' 8"  (1.727 m)    Wt 298 lb (135.2 kg)    BMI 45.31 kg/m   Physical Exam Constitutional:      Appearance: She is well-developed.  HENT:     Head: Normocephalic.     Right Ear: External ear normal.     Left Ear: External ear normal.  Eyes:     Pupils: Pupils are equal, round, and reactive to light.  Neck:     Thyroid: No thyromegaly.     Trachea: No tracheal deviation.  Cardiovascular:     Rate and Rhythm: Normal rate.  Pulmonary:     Effort: Pulmonary effort is normal.  Abdominal:     Palpations: Abdomen is soft.  Skin:    General: Skin is warm and dry.  Neurological:     Mental Status: She is alert and oriented to person, place, and time.  Psychiatric:        Mood and Affect: Mood and affect normal.  Behavior: Behavior normal.     Ortho Exam patient has mild tenderness at the MP joint of the thumb primarily along the radial aspect.  Collateral ligaments are stable and symmetrical to the opposite right thumb.  Sensation the fingertip is normal no triggering.  Flexor and extensor function is intact first dorsal compartment second third are normal snuffbox scaphoid is nontender tuberosity of the scaphoid is normal.  Normal digital Allen's test. Specialty Comments:  No specialty comments available.  Imaging: No results found.   PMFS History: There are no problems to display for this patient.  No past medical history on file.  No family history on file.  Past Surgical History:  Procedure Laterality Date   TONSILLECTOMY AND ADENOIDECTOMY     WISDOM TOOTH EXTRACTION     Social History   Occupational History   Not on file  Tobacco Use   Smoking status: Never Smoker   Smokeless tobacco: Never Used  Substance and Sexual Activity   Alcohol use: Yes    Comment: rare   Drug use: Never    Comment: thc  gummies   Sexual activity: Not on file

## 2021-01-05 ENCOUNTER — Encounter: Payer: Self-pay | Admitting: Orthopaedic Surgery

## 2021-01-05 ENCOUNTER — Ambulatory Visit: Payer: BC Managed Care – PPO | Admitting: Orthopaedic Surgery

## 2021-01-05 DIAGNOSIS — M79645 Pain in left finger(s): Secondary | ICD-10-CM

## 2021-01-05 DIAGNOSIS — M79646 Pain in unspecified finger(s): Secondary | ICD-10-CM | POA: Insufficient documentation

## 2021-01-05 MED ORDER — MELOXICAM 15 MG PO TABS: 15.0000 mg | ORAL_TABLET | Freq: Every day | ORAL | 0 refills | Status: DC

## 2021-01-05 NOTE — Progress Notes (Signed)
Office Visit Note   Patient: Brooke Dean           Date of Birth: 30-Oct-1991           MRN: [id number] Visit Date: 01/05/2021              Requested by: No referring provider defined for this encounter. PCP: Patient, No Pcp Per   Assessment & Plan: Visit Diagnoses:  1. Pain of left thumb     Plan: We will renew the meloxicam I will recheck her in 2 months.  She continues to splint when she is not at work intermittently.  She does not need to leave it on when she sleeps.  She can use some ice of it sore after work hours.  Recheck 2 months for likely final visit.  Follow-Up Instructions: Return in about 2 months (around 03/05/2021).   Orders:  No orders of the defined types were placed in this encounter.  Meds ordered this encounter  Medications  . meloxicam (MOBIC) 15 MG tablet    Sig: Take 1 tablet (15 mg total) by mouth daily.    Dispense:  30 tablet    Refill:  0      Procedures: No procedures performed   Clinical Data: No additional findings.   Subjective: Chief Complaint  Patient presents with  . Left Thumb - Follow-up    HPI 30 year old female works at [employer] returns for 4-week follow-up after thumb injury.  She is using a splint when she is not working but still has to remove the splint to do her job at work.  She has had some continued pain with grasping thinks is gotten some improvement with time.  Pain does not wake her up at night.  She is not had to leave work from the problem with her thumb since last seen 12/08/2020.  Review of Systems all other systems update noncontributory to HPI.   Objective: Vital Signs: BP 113/72   Pulse 66   Physical Exam Constitutional:      Appearance: She is well-developed.  HENT:     Head: Normocephalic.     Right Ear: External ear normal.     Left Ear: External ear normal.  Eyes:     Pupils: Pupils are equal, round, and reactive to light.  Neck:     Thyroid: No thyromegaly.     Trachea: No  tracheal deviation.  Cardiovascular:     Rate and Rhythm: Normal rate.  Pulmonary:     Effort: Pulmonary effort is normal.  Abdominal:     Palpations: Abdomen is soft.  Skin:    General: Skin is warm and dry.  Neurological:     Mental Status: She is alert and oriented to person, place, and time.  Psychiatric:        Mood and Affect: Mood and affect normal.        Behavior: Behavior normal.     Ortho Exam patient has mild tenderness along the left radial aspect MP joint.  Collateral ligament is stable.  Good flexion-extension resistance.  A1 pulley is normal no triggering.  CMC joint scaphoid tuberosity and body is stable no dorsal wrist tenderness.  Station of the thumb is intact. Specialty Comments:  No specialty comments available.  Imaging: No results found.   PMFS History: Patient Active Problem List   Diagnosis Date Noted  . Thumb pain 01/05/2021   History reviewed. No pertinent past medical history.  History reviewed. No pertinent family  history.  Past Surgical History:  Procedure Laterality Date  . TONSILLECTOMY AND ADENOIDECTOMY    . WISDOM TOOTH EXTRACTION     Social History   Occupational History  . Not on file  Tobacco Use  . Smoking status: Never Smoker  . Smokeless tobacco: Never Used  Substance and Sexual Activity  . Alcohol use: Yes    Comment: rare  . Drug use: Never    Comment: thc gummies  . Sexual activity: Not on file

## 2021-01-22 ENCOUNTER — Ambulatory Visit: Payer: BC Managed Care – PPO | Admitting: Podiatry

## 2021-01-22 ENCOUNTER — Other Ambulatory Visit: Payer: Self-pay

## 2021-01-22 DIAGNOSIS — M2142 Flat foot [pes planus] (acquired), left foot: Secondary | ICD-10-CM | POA: Diagnosis not present

## 2021-01-22 DIAGNOSIS — M7661 Achilles tendinitis, right leg: Secondary | ICD-10-CM | POA: Diagnosis not present

## 2021-01-22 DIAGNOSIS — M2141 Flat foot [pes planus] (acquired), right foot: Secondary | ICD-10-CM | POA: Diagnosis not present

## 2021-01-25 NOTE — Progress Notes (Signed)
Subjective: 30 year old female presents the office for follow-up evaluation of right Achilles tendinitis/heel pain.  She is still doing physical therapy and states that she is doing better.  She has not seen any significant swelling.  She does wear shoes with arch supports.  No recent injury or trauma otherwise no other changes.  Objective: AAO x3, NAD DP/PT pulses palpable bilaterally, CRT less than 3 seconds There is significant tenderness to palpation the course of insertion of the Achilles tendon.  The Achilles tendon appears to be intact.  There is no pain with lateral compression of the calcaneus.  No pain in the plantar fascia.   Decreased medial arch upon weightbearing.  No erythema point tenderness.  MMT 5/5. Flatfoot present No pain with calf compression, swelling, warmth, erythema  Assessment: Right Achilles tendinitis, equinus  Plan: -All treatment options discussed with the patient including all alternatives, risks, complications.  -Overall she is continuing to improve and not having significant discomfort.  On her continue physical therapy.  Continue with supportive shoes, arch supports.    Vivi Barrack DPM

## 2021-02-16 ENCOUNTER — Telehealth: Payer: Self-pay | Admitting: Podiatry

## 2021-02-16 NOTE — Telephone Encounter (Signed)
Pt sees Dr. Ardelle Anton and is requesting and additional 6 month

## 2021-02-18 ENCOUNTER — Other Ambulatory Visit: Payer: Self-pay | Admitting: Orthopaedic Surgery

## 2021-02-19 NOTE — Telephone Encounter (Signed)
Ok to rf? 

## 2021-03-06 ENCOUNTER — Ambulatory Visit: Payer: BC Managed Care – PPO | Admitting: Orthopaedic Surgery

## 2021-03-06 ENCOUNTER — Encounter: Payer: Self-pay | Admitting: Orthopaedic Surgery

## 2021-03-06 ENCOUNTER — Other Ambulatory Visit: Payer: Self-pay

## 2021-03-06 ENCOUNTER — Ambulatory Visit: Payer: BC Managed Care – PPO

## 2021-03-06 VITALS — BP 129/83 | HR 56 | Ht 68.0 in | Wt 298.0 lb

## 2021-03-06 DIAGNOSIS — M25562 Pain in left knee: Secondary | ICD-10-CM | POA: Diagnosis not present

## 2021-03-06 NOTE — Progress Notes (Signed)
   Office Visit Note   Patient: Brooke Dean           Date of Birth: [birth year]           MRN: [id number] Visit Date: 03/06/2021              Requested by: No referring provider defined for this encounter. PCP: Patient, No Pcp Per (Inactive)   Assessment & Plan: Visit Diagnoses:  1. 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  Follow-Up Instructions: No follow-ups on file.   Orders:  Orders Placed This Encounter  Procedures  . XR KNEE 3 VIEW LEFT   No orders of the defined types were placed in this encounter.     Procedures: No procedures performed   Clinical Data: No additional findings.   Subjective: Chief Complaint  Patient presents with  . Left Thumb - Follow-up, Pain  . Left Knee - Pain    HPI 30 year old female returns with left knee pain and popping started 2 to 3 months ago.  No known injury.  Her pain has been persistent she has noticed some swelling.  She has not had any falling episodes.  She is already going to therapy at [facility] for [facility] and Achilles tendon problems. Review of Systems updated unchanged.   Objective: Vital Signs: BP 129/83   Pulse (!) 56   Ht 5\' 8"  (1.727 m)   Wt 298 lb (135.2 kg)   BMI 45.31 kg/m   Physical Exam Constitutional:      Appearance: She is well-developed.  HENT:     Head: Normocephalic.     Right Ear: External ear normal.     Left Ear: External ear normal.  Eyes:     Pupils: Pupils are equal, round, and reactive to light.  Neck:     Thyroid: No thyromegaly.     Trachea: No tracheal deviation.  Cardiovascular:     Rate and Rhythm: Normal rate.  Pulmonary:     Effort: Pulmonary effort is normal.  Abdominal:     Palpations: Abdomen is soft.  Skin:    General: Skin is warm and dry.  Neurological:     Mental Status: She is alert and oriented to person, place, and time.   Psychiatric:        Behavior: Behavior normal.     Ortho Exam patient has no knee effusion.  Collateral ligaments are stable.  ACL PCL exam is normal.  Mild tenderness adjacent to the patella.  Negative plica exam.  Pes bursa tib-fib joint is normal.  Specialty Comments:  No specialty comments available.  Imaging: No results found.   PMFS History: Patient Active Problem List   Diagnosis Date Noted  . Thumb pain 01/05/2021   History reviewed. No pertinent past medical history.  History reviewed. No pertinent family history.  Past Surgical History:  Procedure Laterality Date  . TONSILLECTOMY AND ADENOIDECTOMY    . WISDOM TOOTH EXTRACTION     Social History   Occupational History  . Not on file  Tobacco Use  . Smoking status: Never Smoker  . Smokeless tobacco: Never Used  Substance and Sexual Activity  . Alcohol use: Yes    Comment: rare  . Drug use: Never    Comment: thc gummies  . Sexual activity: Not on file

## 2021-03-23 ENCOUNTER — Other Ambulatory Visit: Payer: Self-pay | Admitting: Orthopaedic Surgery

## 2021-03-23 NOTE — Telephone Encounter (Signed)
pls

## 2021-04-10 ENCOUNTER — Ambulatory Visit (INDEPENDENT_AMBULATORY_CARE_PROVIDER_SITE_OTHER): Payer: BC Managed Care – PPO | Admitting: Orthopaedic Surgery

## 2021-04-10 ENCOUNTER — Encounter: Payer: Self-pay | Admitting: Orthopaedic Surgery

## 2021-04-10 VITALS — BP 128/85 | HR 67 | Ht 68.0 in | Wt 290.0 lb

## 2021-04-10 DIAGNOSIS — M79645 Pain in left finger(s): Secondary | ICD-10-CM | POA: Diagnosis not present

## 2021-04-10 DIAGNOSIS — M25562 Pain in left knee: Secondary | ICD-10-CM | POA: Insufficient documentation

## 2021-04-10 NOTE — Progress Notes (Signed)
Office Visit Note   Patient: Brooke Dean           Date of Birth: October 17, 1991           MRN: [id number] Visit Date: 04/10/2021              Requested by: No referring provider defined for this encounter. PCP: Patient, No Pcp Per (Inactive)   Assessment & Plan: Visit Diagnoses:  1. Pain of left thumb   2. Left knee pain, unspecified chronicity     Plan: We discussed continuing exercises she is done over his therapy.  She can use some Aspercreme use her splint when she is not working since she is not able to wear the thumb splint and still do the package distribution job at [employer].  She gets increasing symptoms she can call about proceeding with an MRI scan of her knee.  We will follow-up on an as-needed basis if she has increased symptoms.  Follow-Up Instructions: No follow-ups on file.   Orders:  No orders of the defined types were placed in this encounter.  No orders of the defined types were placed in this encounter.     Procedures: No procedures performed   Clinical Data: No additional findings.   Subjective: Chief Complaint  Patient presents with  . Left Knee - Pain, Follow-up  . Left Thumb - Pain, Follow-up    HPI 30 year old female returns with ongoing problems with right thumb pain more recently right small finger pain and also continued left knee pain.  She has been through therapy for her knee which is helped.  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  She denies associated neck pain no chills or fever.  No rheumatologic conditions.  Patient is taking Corporate investment banker part-time at [employer] and plans to transition to this field as soon as she finishes her degree.  Review of Systems updated unchanged as it pertains to HPI.   Objective: Vital Signs: BP 128/85   Pulse 67   Ht 5\' 8"  (1.727 m)   Wt 290 lb (131.5 kg)   BMI 44.09 kg/m   Physical Exam Constitutional:      Appearance:  She is well-developed.  HENT:     Head: Normocephalic.     Right Ear: External ear normal.     Left Ear: External ear normal.  Eyes:     Pupils: Pupils are equal, round, and reactive to light.  Neck:     Thyroid: No thyromegaly.     Trachea: No tracheal deviation.  Cardiovascular:     Rate and Rhythm: Normal rate.  Pulmonary:     Effort: Pulmonary effort is normal.  Abdominal:     Palpations: Abdomen is soft.  Skin:    General: Skin is warm and dry.  Neurological:     Mental Status: She is alert and oriented to person, place, and time.  Psychiatric:        Behavior: Behavior normal.     Ortho Exam patient is amatory without left knee limp circumduction type gait due to increased BMI.  Knees reach full extension she slightly hyperextends right and left.  Negative logroll the hips.  No triggering of the thumb or fifth finger right hand.  Ulnar nerve is stable right left elbow.  Negative Tinel's no hyperthenar or thenar atrophy. Specialty Comments:  No specialty comments available.  Imaging: No results found.  PMFS History: Patient Active Problem List   Diagnosis Date Noted  . Pain in left knee 04/10/2021  . Thumb pain 01/05/2021   No past medical history on file.  No family history on file.  Past Surgical History:  Procedure Laterality Date  . TONSILLECTOMY AND ADENOIDECTOMY    . WISDOM TOOTH EXTRACTION     Social History   Occupational History  . Not on file  Tobacco Use  . Smoking status: Never Smoker  . Smokeless tobacco: Never Used  Substance and Sexual Activity  . Alcohol use: Yes    Comment: rare  . Drug use: Never    Comment: thc gummies  . Sexual activity: Not on file

## 2021-04-24 ENCOUNTER — Other Ambulatory Visit: Payer: Self-pay

## 2021-04-24 ENCOUNTER — Ambulatory Visit: Payer: BC Managed Care – PPO | Admitting: Podiatry

## 2021-04-24 DIAGNOSIS — M7661 Achilles tendinitis, right leg: Secondary | ICD-10-CM

## 2021-04-24 DIAGNOSIS — M2141 Flat foot [pes planus] (acquired), right foot: Secondary | ICD-10-CM

## 2021-04-24 DIAGNOSIS — M2142 Flat foot [pes planus] (acquired), left foot: Secondary | ICD-10-CM | POA: Diagnosis not present

## 2021-04-24 NOTE — Patient Instructions (Signed)

## 2021-04-29 NOTE — Progress Notes (Signed)
Subjective: 30 year old female presents the office today for follow-up evaluation of Achilles tendinitis, equinus soft, flat feet.  She states that she is doing good not any issues with significant pain.  She does however take meloxicam, anti-inflammatories on daily basis.  No recent injury or trauma to her feet no other concerns to her lower extremities today. Denies any systemic complaints such as fevers, chills, nausea, vomiting. No acute changes since last appointment, and no other complaints at this time.   Objective: AAO x3, NAD DP/PT pulses palpable bilaterally, CRT less than 3 seconds Flatfoot is present with decreased medial arch upon weightbearing.  Point distal aspect there is no significant tenderness on the Achilles tendon.  No areas of discomfort of the foot.  Ankle, subtalar joint range of motion intact.  No pain with calf compression, swelling, warmth, erythema  Assessment: Flatfoot, Achilles tendinitis with much improvement  Plan: -All treatment options discussed with the patient including all alternatives, risks, complications.  -Overall doing much better.  I want her to continue stretching, icing daily as well as wearing shoes and good arch supports.  I would like to decrease amount of anti-inflammatories that she is taking she can wean off with this.  If she is continuing to need chronic NSAIDs for her feet to let me know and we can try other options. -Patient encouraged to call the office with any questions, concerns, change in symptoms.   Vivi Barrack DPM

## 2021-05-05 ENCOUNTER — Other Ambulatory Visit: Payer: Self-pay | Admitting: Surgery

## 2021-05-07 ENCOUNTER — Other Ambulatory Visit: Payer: Self-pay | Admitting: Podiatry

## 2021-05-25 ENCOUNTER — Other Ambulatory Visit: Payer: Self-pay

## 2021-05-25 ENCOUNTER — Emergency Department (HOSPITAL_COMMUNITY)
Admission: EM | Admit: 2021-05-25 | Discharge: 2021-05-26 | Disposition: A | Discharge status: Home / Self Care | Payer: BC Managed Care – PPO | Attending: Emergency Medicine | Admitting: Emergency Medicine

## 2021-05-25 DIAGNOSIS — R1012 Left upper quadrant pain: Secondary | ICD-10-CM

## 2021-05-26 ENCOUNTER — Emergency Department (HOSPITAL_COMMUNITY): Payer: BC Managed Care – PPO

## 2021-05-26 LAB — URINALYSIS, ROUTINE W REFLEX MICROSCOPIC
Bacteria, UA: NONE SEEN
Bilirubin Urine: NEGATIVE
Glucose, UA: NEGATIVE mg/dL
Ketones, ur: NEGATIVE mg/dL
Leukocytes,Ua: NEGATIVE
Nitrite: NEGATIVE
Protein, ur: NEGATIVE mg/dL
Specific Gravity, Urine: 1.021 (ref 1.005–1.030)
pH: 5 (ref 5.0–8.0)

## 2021-05-26 LAB — CBC WITH DIFFERENTIAL/PLATELET
Abs Immature Granulocytes: 0.01 10*3/uL (ref 0.00–0.07)
Basophils Absolute: 0.1 10*3/uL (ref 0.0–0.1)
Basophils Relative: 1 %
Eosinophils Absolute: 0.1 10*3/uL (ref 0.0–0.5)
Eosinophils Relative: 2 %
HCT: 39 % (ref 36.0–46.0)
Hemoglobin: 13 g/dL (ref 12.0–15.0)
Immature Granulocytes: 0 %
Lymphocytes Relative: 51 %
Lymphs Abs: 3.2 10*3/uL (ref 0.7–4.0)
MCH: 30.6 pg (ref 26.0–34.0)
MCHC: 33.3 g/dL (ref 30.0–36.0)
MCV: 91.8 fL (ref 80.0–100.0)
Monocytes Absolute: 0.5 10*3/uL (ref 0.1–1.0)
Monocytes Relative: 8 %
Neutro Abs: 2.4 10*3/uL (ref 1.7–7.7)
Neutrophils Relative %: 38 %
Platelets: 280 10*3/uL (ref 150–400)
RBC: 4.25 MIL/uL (ref 3.87–5.11)
RDW: 12 % (ref 11.5–15.5)
WBC: 6.4 10*3/uL (ref 4.0–10.5)
nRBC: 0 % (ref 0.0–0.2)

## 2021-05-26 LAB — COMPREHENSIVE METABOLIC PANEL
ALT: 18 U/L (ref 0–44)
AST: 22 U/L (ref 15–41)
Albumin: 3.3 g/dL — ABNORMAL LOW (ref 3.5–5.0)
Alkaline Phosphatase: 63 U/L (ref 38–126)
Anion gap: 6 (ref 5–15)
BUN: 12 mg/dL (ref 6–20)
CO2: 25 mmol/L (ref 22–32)
Calcium: 9 mg/dL (ref 8.9–10.3)
Chloride: 107 mmol/L (ref 98–111)
Creatinine, Ser: 0.84 mg/dL (ref 0.44–1.00)
GFR, Estimated: >60 mL/min (ref >60)
Glucose, Bld: 108 mg/dL — ABNORMAL HIGH (ref 70–99)
Potassium: 4.1 mmol/L (ref 3.5–5.1)
Sodium: 138 mmol/L (ref 135–145)
Total Bilirubin: 0.5 mg/dL (ref 0.3–1.2)
Total Protein: 6.5 g/dL (ref 6.5–8.1)

## 2021-05-26 LAB — I-STAT BETA HCG BLOOD, ED (MC, WL, AP ONLY): I-stat hCG, quantitative: <5 m[IU]/mL (ref <5)

## 2021-05-26 LAB — LIPASE, BLOOD: Lipase: 31 U/L (ref 11–51)

## 2021-05-26 NOTE — ED Triage Notes (Signed)
Pt came in with c/o LUQ abdominal pain that started three days ago. Pt states it comes and goes. She took simethicone today with little relief.

## 2021-05-26 NOTE — ED Notes (Signed)
Pt ambulatory to bathroom

## 2021-05-26 NOTE — Discharge Instructions (Addendum)
Take ibuprofen 600 mg every 6 hours as needed for pain.  Rest.  Follow-up with primary doctor if symptoms are not improving in the next few days, and return to the ER if you develop severe abdominal pain, high fevers, bloody stools, or other new and concerning symptoms.

## 2021-05-26 NOTE — ED Provider Notes (Signed)
[facility]-EMERGENCY DEPT Provider Note   CSN: [id number] Arrival date & time: 05/25/21  2329     History Chief Complaint  Patient presents with   Abdominal Pain    CHARLEN BAKULA is a 30 y.o. female.  Patient is a 30 year old female presenting with a 3-day history of left upper quadrant abdominal pain.  She describes pain that comes and goes and is crampy in nature.  She reports having normal bowel movements and denies constipation or diarrhea.  She denies any black or bloody stools.  She denies any urinary complaints.  Pain is worse with movement and palpation.  She has attempted to take simethicone with little relief.  She is currently on her menstrual period and denies the possibility of pregnancy.  The history is provided by the patient.  Abdominal Pain Pain location:  LUQ Pain quality: cramping   Pain radiates to:  Does not radiate Pain severity:  Moderate Onset quality:  Gradual Duration:  3 days Timing:  Intermittent Progression:  Worsening Chronicity:  New Relieved by:  Nothing Worsened by:  Movement and palpation Ineffective treatments: Simethicone.     No past medical history on file.  Patient Active Problem List   Diagnosis Date Noted   Pain in left knee 04/10/2021   Thumb pain 01/05/2021    Past Surgical History:  Procedure Laterality Date   TONSILLECTOMY AND ADENOIDECTOMY     WISDOM TOOTH EXTRACTION       OB History   No obstetric history on file.     No family history on file.  Social History   Tobacco Use   Smoking status: Never   Smokeless tobacco: Never  Substance Use Topics   Alcohol use: Yes    Comment: rare   Drug use: Never    Comment: thc gummies    Home Medications Prior to Admission medications   Medication Sig Start Date End Date Taking? Authorizing Provider  ciclopirox (PENLAC) 8 % solution Apply topically at bedtime. Apply over nail and surrounding skin. Apply daily over previous coat. After  seven (7) days, may remove with alcohol and continue cycle. 07/12/20   Vivi Barrack, DPM  etonogestrel-ethinyl estradiol (NUVARING) 0.12-0.015 MG/24HR vaginal ring Place vaginally. 08/23/20   [provider]  fluticasone (FLONASE) 50 MCG/ACT nasal spray Place 1 spray into both nostrils daily. Patient not taking: No sig reported 06/15/20   Merrilee Jansky, MD  meloxicam (MOBIC) 15 MG tablet TAKE 1 TABLET (15 MG TOTAL) BY MOUTH DAILY. 05/09/21   Eldred Manges, MD  meloxicam (MOBIC) 15 MG tablet TAKE 1 TABLET BY MOUTH EVERY DAY 05/08/21   Vivi Barrack, DPM  methylPREDNISolone (MEDROL DOSEPAK) 4 MG TBPK tablet Take as directed Patient not taking: No sig reported 07/06/20   Vivi Barrack, DPM  oxybutynin (DITROPAN-XL) 5 MG 24 hr tablet Take 5 mg by mouth daily. 10/25/20   [provider]    Allergies    Other  Review of Systems   Review of Systems  Gastrointestinal:  Positive for abdominal pain.  All other systems reviewed and are negative.  Physical Exam Updated Vital Signs BP 135/72   Pulse (!) 57   Temp 98.7 F (37.1 C) (Oral)   Resp 16   Ht 5\' 8"  (1.727 m)   Wt 133.4 kg   SpO2 97%   BMI 44.70 kg/m   Physical Exam Vitals and nursing note reviewed.  Constitutional:      General: She is not  in acute distress.    Appearance: She is well-developed. She is not diaphoretic.  HENT:     Head: Normocephalic and atraumatic.  Cardiovascular:     Rate and Rhythm: Normal rate and regular rhythm.     Heart sounds: No murmur heard.   No friction rub. No gallop.  Pulmonary:     Effort: Pulmonary effort is normal. No respiratory distress.     Breath sounds: Normal breath sounds. No wheezing.  Abdominal:     General: Bowel sounds are normal. There is no distension.     Palpations: Abdomen is soft.     Tenderness: There is abdominal tenderness in the left upper quadrant. There is no right CVA tenderness, left CVA tenderness, guarding or rebound.   Musculoskeletal:        General: Normal range of motion.     Cervical back: Normal range of motion and neck supple.  Skin:    General: Skin is warm and dry.  Neurological:     General: No focal deficit present.     Mental Status: She is alert and oriented to person, place, and time.    ED Results / Procedures / Treatments   Labs (all labs ordered are listed, but only abnormal results are displayed) Labs Reviewed  CBC WITH DIFFERENTIAL/PLATELET  COMPREHENSIVE METABOLIC PANEL  LIPASE, BLOOD  URINALYSIS, ROUTINE W REFLEX MICROSCOPIC  I-STAT BETA HCG BLOOD, ED ([facility], [facility], [facility] ONLY)    EKG None  Radiology No results found.  Procedures Procedures   Medications Ordered in ED Medications - No data to display  ED Course  I have reviewed the triage vital signs and the nursing notes.  Pertinent labs & imaging results that were available during my care of the patient were reviewed by me and considered in my medical decision making (see chart for details).    MDM Rules/Calculators/A&P  Work-up into the cause of this patient's left upper quadrant pain is unremarkable including laboratory studies, urinalysis, and CT scan.  Her vitals are stable and nothing appears acute.  At this point, discharge seems appropriate.  Patient will be advised to take ibuprofen, rest, and follow-up with primary doctor as needed.  Final Clinical Impression(s) / ED Diagnoses Final diagnoses:  None    Rx / DC Orders ED Discharge Orders     None        Geoffery Lyons, MD 05/26/21 [phone]

## 2021-10-05 ENCOUNTER — Other Ambulatory Visit: Payer: Self-pay | Admitting: Podiatry

## 2021-10-05 NOTE — Telephone Encounter (Signed)
Please advise 

## 2021-10-23 ENCOUNTER — Telehealth: Payer: Self-pay | Admitting: Podiatry

## 2021-10-23 NOTE — Telephone Encounter (Signed)
Pt left message asking for Dr Raiford Noble to discuss orthotics. Her current pair have holes in the heels (left is worse) causing her plantar fasciitis to flare up.  I called pt back and  told pt Raiford Noble was no longer with [place] and we discussed with her the that we could order her a new pair like the previous ones or we could refurbish the ones she has for 90.00 and we would bill the insurance for 438.00 for the new ones. She is going to think about it and call me back.

## 2021-11-10 ENCOUNTER — Other Ambulatory Visit: Payer: Self-pay | Admitting: Podiatry

## 2021-11-20 ENCOUNTER — Ambulatory Visit: Payer: Self-pay

## 2021-11-20 ENCOUNTER — Other Ambulatory Visit: Payer: Self-pay

## 2021-11-20 ENCOUNTER — Other Ambulatory Visit: Payer: Self-pay | Admitting: Family Medicine

## 2021-11-20 DIAGNOSIS — M25531 Pain in right wrist: Secondary | ICD-10-CM

## 2021-12-12 ENCOUNTER — Telehealth: Payer: Self-pay | Admitting: Podiatry

## 2021-12-12 NOTE — Telephone Encounter (Signed)
Pt left message with questions about the refurbishing of her orthotics. She was aware of the 90.00 but wanted to know if that was after insurance.  I returned call and explained we do not bill insurance for refurbished orthotics it is self pay and the cost is 90.00. She asked if it would be cheaper to just order another pair and I told pt I was not sure of her insurance coverage but usually this time of year if they cover the orthotics it goes towards the deductible and the cost for a new pair is 438.00 and we could bill insurance. She is thinking about it.

## 2021-12-19 ENCOUNTER — Encounter: Payer: Self-pay | Admitting: Surgery

## 2021-12-19 ENCOUNTER — Encounter: Payer: Self-pay | Admitting: *Deleted

## 2021-12-19 ENCOUNTER — Ambulatory Visit (INDEPENDENT_AMBULATORY_CARE_PROVIDER_SITE_OTHER): Payer: BC Managed Care – PPO | Admitting: Surgery

## 2021-12-19 ENCOUNTER — Telehealth: Payer: Self-pay

## 2021-12-19 ENCOUNTER — Other Ambulatory Visit: Payer: Self-pay

## 2021-12-19 DIAGNOSIS — M654 Radial styloid tenosynovitis [de Quervain]: Secondary | ICD-10-CM | POA: Diagnosis not present

## 2021-12-19 DIAGNOSIS — M25531 Pain in right wrist: Secondary | ICD-10-CM | POA: Diagnosis not present

## 2021-12-19 NOTE — Progress Notes (Signed)
Pt is having clicking noise in wrist especially when moving thumb. Also, wants to know if can be released from work and if you thiink she will need Physical therapy.   Last xray done on 11/20/21, denied workers comp.

## 2021-12-19 NOTE — Telephone Encounter (Signed)
Please advise on message below.

## 2021-12-19 NOTE — Progress Notes (Signed)
Office Visit Note   Patient: Brooke Dean           Date of Birth: 1991-05-06           MRN: [id number] Visit Date: 12/19/2021              Requested by: No referring provider defined for this encounter. PCP: Patient, No Pcp Per (Inactive)   Assessment & Plan: Visit Diagnoses:  1. De Quervain's tenosynovitis, right   2. Right wrist pain     Plan:  Advised patient that at this point I recommend doing some gentle stretching exercises of the wrist and also using over-the-counter Voltaren gel twice daily as directed.  Advised her that with the question of injury at work and her ongoing symptoms that I cannot release her at this time.  She will follow-up in 2 weeks with hand surgeon Dr. Frazier Butt here in our office for repeat exam and he can also give his recommendations.  I advised patient that if she still continues to be symptomatic that he may discuss formal PT and/or possible injection for de Quervain's.  All questions answered.   Follow-Up Instructions: Return in about 2 weeks (around 01/02/2022) for WITH DR BENFIELD FOR RECHECK RIGHT WRIST PAIN.   Orders:  No orders of the defined types were placed in this encounter.  No orders of the defined types were placed in this encounter.     Procedures: No procedures performed   Clinical Data: No additional findings.   Subjective: Chief Complaint  Patient presents with   Right Wrist - Pain    Pt states wrist clicks when moving thumb    HPI 31 year-old black female comes in today with complaints of right wrist pain.  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  States that while sorting she had pain and clicking in her wrist.  Continues have ongoing clicking symptoms along the radial aspect with some episodes of sharp pain.  She was previously [facility]res[facility] and she was eventually referred to our office after failed conservative  management.  Patient states that her job told her that she needed a release to go back to work.  She also states that [employer]. denied her claim.  Patient did have right wrist x-ray November 20, 2021 that was ordered by Dr. Theresia Lo and this showed:  CLINICAL DATA:  Right wrist pain   EXAM: RIGHT WRIST - COMPLETE 3+ VIEW   COMPARISON:  None.   FINDINGS: There is no acute fracture or dislocation. Bony alignment is normal. The joint spaces are preserved. There is no erosive change. The soft tissues are unremarkable.   IMPRESSION: No acute fracture or dislocation.     Electronically Signed   By: Lesia Hausen M.D.   On: 11/20/2021 10:40   Review of Systems No cardiac, pulm, GI GU issues  Objective: Vital Signs: LMP  (LMP Unknown)   Physical Exam HENT:     Head: Normocephalic.  Eyes:     Extraocular Movements: Extraocular movements intact.  Musculoskeletal:     Comments: Right wrist she has good range of motion.  She is mild tenderness over the first dorsal compartment.  Mildly positive Finkelstein's test.  No swelling noted.  Negative Tinel's and Phalen's test.  Sensation intact.  Neurological:     Mental Status: She is alert.  Psychiatric:        Mood and  Affect: Mood normal.    Ortho Exam  Specialty Comments:  No specialty comments available.  Imaging: No results found.   PMFS History: Patient Active Problem List   Diagnosis Date Noted   Pain in left knee 04/10/2021   Thumb pain 01/05/2021   No past medical history on file.  No family history on file.  Past Surgical History:  Procedure Laterality Date   TONSILLECTOMY AND ADENOIDECTOMY     WISDOM TOOTH EXTRACTION     Social History   Occupational History   Not on file  Tobacco Use   Smoking status: Never   Smokeless tobacco: Never  Substance and Sexual Activity   Alcohol use: Yes    Comment: rare   Drug use: Never    Comment: thc gummies   Sexual activity: Not on file

## 2021-12-19 NOTE — Telephone Encounter (Signed)
Patient called stating that she needs a note saying that she was seen today and will not be released back to work until further eval at her upcoming apt with Dr .Frazier Butt..   Please advise

## 2021-12-20 NOTE — Telephone Encounter (Signed)
You seen this patient yesterday 12/19/2021. Are you fine with her being out of work until she is seen and evaluated by Dr. Frazier Butt on 01/01/2022.

## 2021-12-20 NOTE — Telephone Encounter (Signed)
Are you okay with me creating this note ?

## 2022-01-01 ENCOUNTER — Ambulatory Visit: Payer: BC Managed Care – PPO | Admitting: Orthopedic Surgery

## 2022-01-01 ENCOUNTER — Other Ambulatory Visit: Payer: Self-pay

## 2022-01-01 ENCOUNTER — Encounter: Payer: Self-pay | Admitting: Orthopedic Surgery

## 2022-01-01 DIAGNOSIS — M79645 Pain in left finger(s): Secondary | ICD-10-CM

## 2022-01-01 NOTE — Progress Notes (Signed)
Office Visit Note   Patient: Brooke Dean           Date of Birth: 1990-12-20           MRN: [id number] Visit Date: 01/01/2022              Requested by: No referring provider defined for this encounter. PCP: Patient, No Pcp Per (Inactive)   Assessment & Plan: Visit Diagnoses:  1. Pain of left thumb     Plan: Discussed the patient that she has an interesting exam but without any pain.  She has a noticeable click around the base of the thumb when extending the wrist from a neutral position with the thumb extended.  This is not painful.  She has no provocative exam findings.  She is not tender to palpation anywhere in the wrist or thumb.  Discussed that if this does not cause her pain she can continue to work as tolerated.  This may be some asymptomatic instability of her first dorsal compartment tendons.  Follow-Up Instructions: No follow-ups on file.   Orders:  No orders of the defined types were placed in this encounter.  No orders of the defined types were placed in this encounter.     Procedures: No procedures performed   Clinical Data: No additional findings.   Subjective: Chief Complaint  Patient presents with   Right Wrist - Follow-up    This is a 31 year old right-hand-dominant female presents with right radial sided wrist symptoms.  She initially was having pain in the radial aspect of the wrist since an injury at work on 12/30 while she was doing her usual activities.  She was then seen by Zonia Kief who noted that she had pain in the first dorsal compartment with a positive Finkelstein test.  She says that she has no pain today.  Her only complaint is that she can feel a click in her wrist when she goes from a neutral wrist position to extended wrist with the thumb also extended.  This clicking is not associated with pain.  She would like to be released back to work.   Review of Systems   Objective: Vital Signs: There were no vitals taken for this  visit.  Physical Exam  Right Hand Exam   Tenderness  The patient is experiencing no tenderness.   Range of Motion  The patient has normal right wrist ROM.   Other  Erythema: absent Sensation: normal Pulse: present  Comments:  No pain w/ Finkelstein test.  Negative [facility] grind test.  No pain in snuffbox.  Non painful but palpable and audible click when extending wrist w/ thumb also extended.      Specialty Comments:  No specialty comments available.  Imaging: No results found.   PMFS History: Patient Active Problem List   Diagnosis Date Noted   Pain in left knee 04/10/2021   Thumb pain 01/05/2021   History reviewed. No pertinent past medical history.  History reviewed. No pertinent family history.  Past Surgical History:  Procedure Laterality Date   TONSILLECTOMY AND ADENOIDECTOMY     WISDOM TOOTH EXTRACTION     Social History   Occupational History   Not on file  Tobacco Use   Smoking status: Never   Smokeless tobacco: Never  Substance and Sexual Activity   Alcohol use: Yes    Comment: rare   Drug use: Never    Comment: thc gummies   Sexual activity: Not on file

## 2022-08-02 LAB — HM PAP SMEAR

## 2022-08-20 ENCOUNTER — Telehealth: Payer: Self-pay | Admitting: *Deleted

## 2022-08-20 NOTE — Telephone Encounter (Signed)
Patient is calling to ask for physician's recommendations on a good pcp and Chiropractor(back issues),doctor usually gives good advice, please advise.

## 2022-08-21 NOTE — Telephone Encounter (Signed)
Spoke with patient giving physician's recommendations, verbalized understanding.

## 2022-09-13 ENCOUNTER — Telehealth: Payer: Self-pay | Admitting: *Deleted

## 2022-09-13 NOTE — Telephone Encounter (Signed)
Patient Is needing to schedule appointment for callouses

## 2022-10-21 ENCOUNTER — Ambulatory Visit: Payer: BC Managed Care – PPO | Admitting: Nurse Practitioner

## 2022-10-21 ENCOUNTER — Encounter: Payer: Self-pay | Admitting: Nurse Practitioner

## 2022-10-21 VITALS — BP 121/70 | HR 55 | Temp 98.5°F | Ht 68.0 in | Wt 301.4 lb

## 2022-10-21 DIAGNOSIS — Z13220 Encounter for screening for lipoid disorders: Secondary | ICD-10-CM | POA: Diagnosis not present

## 2022-10-21 DIAGNOSIS — R0981 Nasal congestion: Secondary | ICD-10-CM

## 2022-10-21 DIAGNOSIS — R7301 Impaired fasting glucose: Secondary | ICD-10-CM

## 2022-10-21 DIAGNOSIS — L659 Nonscarring hair loss, unspecified: Secondary | ICD-10-CM

## 2022-10-21 DIAGNOSIS — M25551 Pain in right hip: Secondary | ICD-10-CM | POA: Diagnosis not present

## 2022-10-21 MED ORDER — FLUTICASONE PROPIONATE 50 MCG/ACT NA SUSP: 2.0000 | Freq: Every day | NASAL | 6 refills | Status: DC

## 2022-10-21 NOTE — Progress Notes (Signed)
New Patient Visit  BP 121/70 (BP Location: Left Arm, Patient Position: Sitting)   Pulse (!) 55   Temp 98.5 F (36.9 C) (Oral)   Ht 5\' 8"  (1.727 m)   Wt (!) 301 lb 6.4 oz (136.7 kg)   SpO2 95%   BMI 45.83 kg/m    Subjective:    Patient ID: , female    DOB: 15-Mar-1991, 31 y.o.   MRN: [id number]  CC: Chief Complaint  Patient presents with   New Patient (Initial Visit)    Going to chiropractor today (hip pain). Want to determine if she has lipodema. Hair loss.    HPI: Brooke Dean is a 31 y.o. female presents for new patient visit to establish care.  Introduced to 37 role and practice setting.  All questions answered.  Discussed provider/patient relationship and expectations.  She has a history of right hip pain for the last few months. She states the pain happened gradually. She states she thinks it's from working at [employer]. She was going to PT and now she is seeing a chiropractor today. She has been trying to stretch regularly. She is not taking any ibuprofen or tylenol.   She is concerned for lipedema. She notices swelling in her legs and arms. SHe states that she has lost 30 pounds and not noticed any difference. She states that this has been going on since college. She has been drinking green tea and water which hhas helped.   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 She notes some hair loss in patches. She is seeing a dermatologist. She is wondering if anything else could be causing the hair loss.   She has a history of depression and anxiety. She went to therapy in the past. She states her symptoms are currently controlled.      10/21/2022    2:27 PM 10/21/2022    1:19 PM  Depression screen PHQ 2/9  Decreased Interest 1 0  Down, Depressed, Hopeless 2 2  PHQ - 2 Score 3 2  Altered sleeping 1   Tired, decreased energy 1   Change in appetite 0   Feeling bad or failure about yourself  1   Trouble concentrating 0    Moving slowly or fidgety/restless 0   Suicidal thoughts 0   PHQ-9 Score 6   Difficult doing work/chores Somewhat difficult       10/21/2022    2:29 PM  GAD 7 : Generalized Anxiety Score  Nervous, Anxious, on Edge 1  Control/stop worrying 1  Worry too much - different things 1  Trouble relaxing 1  Restless 0  Easily annoyed or irritable 0  Afraid - awful might happen 0  Total GAD 7 Score 4  Anxiety Difficulty Somewhat difficult    Past Medical History:  Diagnosis Date   Anxiety    Depression     Past Surgical History:  Procedure Laterality Date   ADENOIDECTOMY Bilateral    31yo   TONSILLECTOMY AND ADENOIDECTOMY     WISDOM TOOTH EXTRACTION      Family History  Problem Relation Age of Onset   Lymphoma Mother    Edema Mother    Heart failure Father    Cancer Maternal Grandfather      Social History   Tobacco Use   Smoking status: Never   Smokeless tobacco: Never  Vaping Use   Vaping Use: Never used  Substance Use Topics   Alcohol use: Not Currently  Comment: rare   Drug use: Not Currently    Comment: thc gummies    Current Outpatient Medications on File Prior to Visit  Medication Sig Dispense Refill   ciclopirox (PENLAC) 8 % solution Apply topically at bedtime. Apply over nail and surrounding skin. Apply daily over previous coat. After seven (7) days, may remove with alcohol and continue cycle. 6.6 mL 4   etonogestrel-ethinyl estradiol (NUVARING) 0.12-0.015 MG/24HR vaginal ring Place vaginally.     meloxicam (MOBIC) 15 MG tablet TAKE 1 TABLET BY MOUTH EVERY DAY 30 tablet 0   No current facility-administered medications on file prior to visit.     Review of Systems  Constitutional:  Positive for fatigue. Negative for fever.  HENT:  Positive for postnasal drip. Negative for congestion, ear pain and sore throat.   Eyes: Negative.   Respiratory: Negative.    Cardiovascular: Negative.   Gastrointestinal: Negative.   Endocrine: Negative.    Genitourinary: Negative.   Musculoskeletal:  Positive for arthralgias (right hip pain).  Skin: Negative.   Neurological:  Positive for headaches (with temperature changes).  Psychiatric/Behavioral:  Positive for dysphoric mood. The patient is not nervous/anxious.       Objective:    BP 121/70 (BP Location: Left Arm, Patient Position: Sitting)   Pulse (!) 55   Temp 98.5 F (36.9 C) (Oral)   Ht 5\' 8"  (1.727 m)   Wt (!) 301 lb 6.4 oz (136.7 kg)   SpO2 95%   BMI 45.83 kg/m   Wt Readings from Last 3 Encounters:  10/21/22 (!) 301 lb 6.4 oz (136.7 kg)  05/25/21 294 lb (133.4 kg)  04/10/21 290 lb (131.5 kg)    BP Readings from Last 3 Encounters:  10/21/22 121/70  05/26/21 128/81  04/10/21 128/85    Physical Exam Vitals and nursing note reviewed.  Constitutional:      General: She is not in acute distress.    Appearance: Normal appearance. She is obese.  HENT:     Head: Normocephalic and atraumatic.     Right Ear: Tympanic membrane, ear canal and external ear normal.     Left Ear: Tympanic membrane, ear canal and external ear normal.  Eyes:     Conjunctiva/sclera: Conjunctivae normal.  Cardiovascular:     Rate and Rhythm: Normal rate and regular rhythm.     Pulses: Normal pulses.     Heart sounds: Normal heart sounds.  Pulmonary:     Effort: Pulmonary effort is normal.     Breath sounds: Normal breath sounds.  Abdominal:     Palpations: Abdomen is soft.     Tenderness: There is no abdominal tenderness.  Musculoskeletal:        General: Normal range of motion.     Cervical back: Normal range of motion and neck supple. No tenderness.     Right lower leg: No edema.     Left lower leg: No edema.  Lymphadenopathy:     Cervical: No cervical adenopathy.  Skin:    General: Skin is warm and dry.  Neurological:     General: No focal deficit present.     Mental Status: She is alert and oriented to person, place, and time.     Cranial Nerves: No cranial nerve deficit.      Coordination: Coordination normal.     Gait: Gait normal.  Psychiatric:        Mood and Affect: Mood normal.        Behavior: Behavior normal.  Thought Content: Thought content normal.        Judgment: Judgment normal.        Assessment & Plan:   Problem List Items Addressed This Visit       Other   Right hip pain - Primary    She has been having right hip pain for the past few months.  She went to physical therapy and has still been trying to do the stretches.  She has an appointment with a chiropractor today.  Encouraged her to keep this appointment.  She can take Tylenol or ibuprofen as needed for pain.      Morbid obesity (HCC)    BMI is 45.8.  Discussed that the largeness in her upper arms and thighs is likely due to obesity.  She can try to lose weight to see if this gets better, however if it is lipedema, it may only get better with liposuction.  Encouraged and discussed healthy eating and exercise.      Other Visit Diagnoses     IFG (impaired fasting glucose)       Last glucose was slihgtly elevated, will check A1c today   Relevant Orders   Hemoglobin A1c   Hair loss       In patches, may be related to foliculitis and dermatitis on scalp. Will check CMP, CBC, iron panel, TSH, and vitamin D today.   Relevant Orders   CBC with Differential/Platelet   Comprehensive metabolic panel   Iron, TIBC and Ferritin Panel   TSH   VITAMIN D 25 Hydroxy (Vit-D Deficiency, Fractures)   Screening, lipid       Screen lipid panel today   Relevant Orders   Lipid panel   Nasal congestion       Start flonase, 2 sprays in each nostril daily. She can also try OTC loratadine or cetirizine 10mg  daily.        Follow up plan: Return in about 3 months (around 01/20/2023) for CPE.

## 2022-10-21 NOTE — Assessment & Plan Note (Signed)
BMI is 45.8.  Discussed that the largeness in her upper arms and thighs is likely due to obesity.  She can try to lose weight to see if this gets better, however if it is lipedema, it may only get better with liposuction.  Encouraged and discussed healthy eating and exercise.

## 2022-10-21 NOTE — Patient Instructions (Signed)
It was great to see you!  We are checking your labs today and will let you know the results via mychart/phone.   Let's follow-up in 3 months, sooner if you have concerns.  If a referral was placed today, you will be contacted for an appointment. Please note that routine referrals can sometimes take up to 3-4 weeks to process. Please call our office if you haven't heard anything after this time frame.  Take care,  Iolanda Folson, NP  

## 2022-10-21 NOTE — Assessment & Plan Note (Signed)
She has been having right hip pain for the past few months.  She went to physical therapy and has still been trying to do the stretches.  She has an appointment with a chiropractor today.  Encouraged her to keep this appointment.  She can take Tylenol or ibuprofen as needed for pain.

## 2022-10-22 LAB — COMPREHENSIVE METABOLIC PANEL
ALT: 17 U/L (ref 0–35)
AST: 16 U/L (ref 0–37)
Albumin: 3.7 g/dL (ref 3.5–5.2)
Alkaline Phosphatase: 63 U/L (ref 39–117)
BUN: 8 mg/dL (ref 6–23)
CO2: 23 mEq/L (ref 19–32)
Calcium: 8.8 mg/dL (ref 8.4–10.5)
Chloride: 107 mEq/L (ref 96–112)
Creatinine, Ser: 0.85 mg/dL (ref 0.40–1.20)
GFR: 92.65 mL/min (ref >60.00)
Glucose, Bld: 80 mg/dL (ref 70–99)
Potassium: 4 mEq/L (ref 3.5–5.1)
Sodium: 138 mEq/L (ref 135–145)
Total Bilirubin: 0.3 mg/dL (ref 0.2–1.2)
Total Protein: 6.4 g/dL (ref 6.0–8.3)

## 2022-10-22 LAB — LIPID PANEL
Cholesterol: 125 mg/dL (ref 0–200)
HDL: 39.1 mg/dL (ref >39.00)
LDL Cholesterol: 71 mg/dL (ref 0–99)
NonHDL: 85.86
Total CHOL/HDL Ratio: 3
Triglycerides: 74 mg/dL (ref 0.0–149.0)
VLDL: 14.8 mg/dL (ref 0.0–40.0)

## 2022-10-22 LAB — IRON,TIBC AND FERRITIN PANEL
%SAT: 15 % (calc) — ABNORMAL LOW (ref 16–45)
Ferritin: 62 ng/mL (ref 16–154)
Iron: 59 ug/dL (ref 40–190)
TIBC: 398 mcg/dL (calc) (ref 250–450)

## 2022-10-22 LAB — CBC WITH DIFFERENTIAL/PLATELET
Basophils Absolute: 0.1 10*3/uL (ref 0.0–0.1)
Basophils Relative: 0.8 % (ref 0.0–3.0)
Eosinophils Absolute: 0.2 10*3/uL (ref 0.0–0.7)
Eosinophils Relative: 3 % (ref 0.0–5.0)
HCT: 40.1 % (ref 36.0–46.0)
Hemoglobin: 13.8 g/dL (ref 12.0–15.0)
Lymphocytes Relative: 57 % — ABNORMAL HIGH (ref 12.0–46.0)
Lymphs Abs: 3.9 10*3/uL (ref 0.7–4.0)
MCHC: 34.4 g/dL (ref 30.0–36.0)
MCV: 90.6 fl (ref 78.0–100.0)
Monocytes Absolute: 0.4 10*3/uL (ref 0.1–1.0)
Monocytes Relative: 5.2 % (ref 3.0–12.0)
Neutro Abs: 2.3 10*3/uL (ref 1.4–7.7)
Neutrophils Relative %: 34 % — ABNORMAL LOW (ref 43.0–77.0)
Platelets: 310 10*3/uL (ref 150.0–400.0)
RBC: 4.42 Mil/uL (ref 3.87–5.11)
RDW: 12.9 % (ref 11.5–15.5)
WBC: 6.9 10*3/uL (ref 4.0–10.5)

## 2022-10-22 LAB — HEMOGLOBIN A1C: Hgb A1c MFr Bld: 6.1 % (ref 4.6–6.5)

## 2022-10-22 LAB — VITAMIN D 25 HYDROXY (VIT D DEFICIENCY, FRACTURES): VITD: 19.23 ng/mL — ABNORMAL LOW (ref 30.00–100.00)

## 2022-10-22 LAB — TSH: TSH: 1.48 u[IU]/mL (ref 0.35–5.50)

## 2022-10-23 MED ORDER — VITAMIN D (ERGOCALCIFEROL) 1.25 MG (50000 UNIT) PO CAPS: 50000.0000 [IU] | ORAL_CAPSULE | ORAL | 0 refills | Status: DC

## 2022-10-23 NOTE — Addendum Note (Signed)
Addended by: Rodman Pickle A on: 10/23/2022 11:22 AM   Modules accepted: Orders

## 2023-01-20 ENCOUNTER — Ambulatory Visit (INDEPENDENT_AMBULATORY_CARE_PROVIDER_SITE_OTHER): Payer: BC Managed Care – PPO | Admitting: Nurse Practitioner

## 2023-01-20 ENCOUNTER — Encounter: Payer: Self-pay | Admitting: Nurse Practitioner

## 2023-01-20 ENCOUNTER — Other Ambulatory Visit (HOSPITAL_COMMUNITY)
Admission: RE | Admit: 2023-01-20 | Discharge: 2023-01-20 | Disposition: A | Discharge status: Home / Self Care | Payer: BC Managed Care – PPO | Source: Ambulatory Visit | Attending: Nurse Practitioner | Admitting: Nurse Practitioner

## 2023-01-20 VITALS — BP 124/82 | HR 73 | Temp 98.4°F | Ht 68.0 in | Wt 301.4 lb

## 2023-01-20 DIAGNOSIS — M25551 Pain in right hip: Secondary | ICD-10-CM | POA: Diagnosis not present

## 2023-01-20 DIAGNOSIS — R7303 Prediabetes: Secondary | ICD-10-CM | POA: Diagnosis not present

## 2023-01-20 DIAGNOSIS — Z113 Encounter for screening for infections with a predominantly sexual mode of transmission: Secondary | ICD-10-CM | POA: Insufficient documentation

## 2023-01-20 DIAGNOSIS — R0982 Postnasal drip: Secondary | ICD-10-CM | POA: Diagnosis not present

## 2023-01-20 DIAGNOSIS — Z0001 Encounter for general adult medical examination with abnormal findings: Secondary | ICD-10-CM

## 2023-01-20 DIAGNOSIS — E559 Vitamin D deficiency, unspecified: Secondary | ICD-10-CM | POA: Diagnosis not present

## 2023-01-20 MED ORDER — LORATADINE 10 MG PO TABS: 10.0000 mg | ORAL_TABLET | Freq: Every day | ORAL | 3 refills | Status: DC

## 2023-01-20 NOTE — Patient Instructions (Signed)
It was great to see you!  I have placed a referral to PT.   We are checking your labs today and will let you know the results via mychart/phone.   Start claritin 1 tablet daily for the congestion. Keep using the mucinex.   Let's follow-up in 3 months, sooner if you have concerns.  If a referral was placed today, you will be contacted for an appointment. Please note that routine referrals can sometimes take up to 3-4 weeks to process. Please call our office if you haven't heard anything after this time frame.  Take care,  Vance Peper, NP

## 2023-01-20 NOTE — Progress Notes (Unsigned)
BP 124/82 (BP Location: Right Arm)   Pulse 73   Temp 98.4 F (36.9 C)   Ht '5\' 8"'$  (1.727 m)   Wt (!) 301 lb 6.4 oz (136.7 kg)   SpO2 96%   BMI 45.83 kg/m    Subjective:    Patient ID: Brooke Dean, female    DOB: 19-Jul-1991, 32 y.o.   MRN: [id number]  CC: Chief Complaint  Patient presents with   Annual Exam    Concerns with right hip pain, sinus issues    HPI: Brooke Dean is a 32 y.o. female presenting on 01/20/2023 for comprehensive medical examination. Current medical complaints include:{Blank single:19197::"none","***"}  Hip pain x2 months  Congestion. Flonase, sudafed. Post nasal drip.   Menopausal Symptoms: no  Depression and Anxiety Screen done today and results listed below:     10/21/2022    2:27 PM 10/21/2022    1:19 PM  Depression screen PHQ 2/9  Decreased Interest 1 0  Down, Depressed, Hopeless 2 2  PHQ - 2 Score 3 2  Altered sleeping 1   Tired, decreased energy 1   Change in appetite 0   Feeling bad or failure about yourself  1   Trouble concentrating 0   Moving slowly or fidgety/restless 0   Suicidal thoughts 0   PHQ-9 Score 6   Difficult doing work/chores Somewhat difficult       10/21/2022    2:29 PM  GAD 7 : Generalized Anxiety Score  Nervous, Anxious, on Edge 1  Control/stop worrying 1  Worry too much - different things 1  Trouble relaxing 1  Restless 0  Easily annoyed or irritable 0  Afraid - awful might happen 0  Total GAD 7 Score 4  Anxiety Difficulty Somewhat difficult    The patient does not have a history of falls. I did not complete a risk assessment for falls. A plan of care for falls was not documented.   Past Medical History:  Past Medical History:  Diagnosis Date   Anxiety    Depression     Surgical History:  Past Surgical History:  Procedure Laterality Date   ADENOIDECTOMY Bilateral    32yo   TONSILLECTOMY AND ADENOIDECTOMY     WISDOM TOOTH EXTRACTION      Medications:  Current Outpatient  Medications on File Prior to Visit  Medication Sig   etonogestrel-ethinyl estradiol (NUVARING) 0.12-0.015 MG/24HR vaginal ring Place vaginally.   fluticasone (FLONASE) 50 MCG/ACT nasal spray Place 2 sprays into both nostrils daily.   meloxicam (MOBIC) 15 MG tablet TAKE 1 TABLET BY MOUTH EVERY DAY   Vitamin D, Ergocalciferol, (DRISDOL) 1.25 MG (50000 UNIT) CAPS capsule Take 1 capsule (50,000 Units total) by mouth every 7 (seven) days.   ciclopirox (PENLAC) 8 % solution Apply topically at bedtime. Apply over nail and surrounding skin. Apply daily over previous coat. After seven (7) days, may remove with alcohol and continue cycle. (Patient not taking: Reported on 01/20/2023)   No current facility-administered medications on file prior to visit.    Allergies:  Allergies  Allergen Reactions   Other Swelling    Spider Bites-Swelling    Social History:  Social History   Socioeconomic History   Marital status: Single    Spouse name: Not on file   Number of children: Not on file   Years of education: Not on file   Highest education level: Not on file  Occupational History   Not on file  Tobacco Use  Smoking status: Never   Smokeless tobacco: Never  Vaping Use   Vaping Use: Never used  Substance and Sexual Activity   Alcohol use: Not Currently    Comment: rare   Drug use: Not Currently    Comment: thc gummies   Sexual activity: Not Currently    Birth control/protection: Inserts  Other Topics Concern   Not on file  Social History Narrative   Not on file   Social Determinants of Health   Financial Resource Strain: Not on file  Food Insecurity: Not on file  Transportation Needs: Not on file  Physical Activity: Not on file  Stress: Not on file  Social Connections: Not on file  Intimate Partner Violence: Not on file   Social History   Tobacco Use  Smoking Status Never  Smokeless Tobacco Never   Social History   Substance and Sexual Activity  Alcohol Use Not Currently    Comment: rare    Family History:  Family History  Problem Relation Age of Onset   Lymphoma Mother    Edema Mother    Heart failure Father    Cancer Maternal Grandfather     Past medical history, surgical history, medications, allergies, family history and social history reviewed with patient today and changes made to appropriate areas of the chart.   ROS All other ROS negative except what is listed above and in the HPI.      Objective:    BP 124/82 (BP Location: Right Arm)   Pulse 73   Temp 98.4 F (36.9 C)   Ht '5\' 8"'$  (1.727 m)   Wt (!) 301 lb 6.4 oz (136.7 kg)   SpO2 96%   BMI 45.83 kg/m   Wt Readings from Last 3 Encounters:  01/20/23 (!) 301 lb 6.4 oz (136.7 kg)  10/21/22 (!) 301 lb 6.4 oz (136.7 kg)  05/25/21 294 lb (133.4 kg)    Physical Exam  Results for orders placed or performed in visit on 10/23/22  HM PAP SMEAR  Result Value Ref Range   HM Pap smear WNL       Assessment & Plan:   Problem List Items Addressed This Visit       Other   Morbid obesity ([place])   Prediabetes   Vitamin D deficiency   Other Visit Diagnoses     Routine general medical examination at a health care facility    -  Primary        Follow up plan: No follow-ups on file.   LABORATORY TESTING:  - Pap smear: {Blank [id number] done","not applicable","up to date","done elsewhere"}  IMMUNIZATIONS:   - Tdap: Tetanus vaccination status reviewed: {tetanus status:315746}. - Influenza: {Blank single:19197::"Up to date","Administered today","Postponed to flu season","Refused","Given elsewhere"} - Pneumovax: {Blank single:19197::"Up to date","Administered today","Not applicable","Refused","Given elsewhere"} - Prevnar: {Blank single:19197::"Up to date","Administered today","Not applicable","Refused","Given elsewhere"} - HPV: {Blank single:19197::"Up to date","Administered today","Not applicable","Refused","Given elsewhere"} - Zostavax vaccine: {Blank single:19197::"Up to  date","Administered today","Not applicable","Refused","Given elsewhere"}  SCREENING: -Mammogram: {Blank single:19197::"Up to date","Ordered today","Not applicable","Refused","Done elsewhere"}  - Colonoscopy: {Blank single:19197::"Up to date","Ordered today","Not applicable","Refused","Done elsewhere"}  - Bone Density: {Blank single:19197::"Up to date","Ordered today","Not applicable","Refused","Done elsewhere"}  -Hearing Test: {Blank single:19197::"Up to date","Ordered today","Not applicable","Refused","Done elsewhere"}  -Spirometry: {Blank single:19197::"Up to date","Ordered today","Not applicable","Refused","Done elsewhere"}   PATIENT COUNSELING:   Advised to take 1 mg of folate supplement per day if capable of pregnancy.   Sexuality: Discussed sexually transmitted diseases, partner selection, use of condoms, avoidance of unintended pregnancy  and contraceptive alternatives.   Advised  to avoid cigarette smoking.  I discussed with the patient that most people either abstain from alcohol or drink within safe limits (<=14/week and <=4 drinks/occasion for males, <=7/weeks and <= 3 drinks/occasion for females) and that the risk for alcohol disorders and other health effects rises proportionally with the number of drinks per week and how often a drinker exceeds daily limits.  Discussed cessation/primary prevention of drug use and availability of treatment for abuse.   Diet: Encouraged to adjust caloric intake to maintain  or achieve ideal body weight, to reduce intake of dietary saturated fat and total fat, to limit sodium intake by avoiding high sodium foods and not adding table salt, and to maintain adequate dietary potassium and calcium preferably from fresh fruits, vegetables, and low-fat dairy products.    stressed the importance of regular exercise  Injury prevention: Discussed safety belts, safety helmets, smoke detector, smoking near bedding or upholstery.   Dental health: Discussed  importance of regular tooth brushing, flossing, and dental visits.    NEXT PREVENTATIVE PHYSICAL DUE IN 1 YEAR. No follow-ups on file.

## 2023-01-21 DIAGNOSIS — Z0000 Encounter for general adult medical examination without abnormal findings: Secondary | ICD-10-CM | POA: Insufficient documentation

## 2023-01-21 DIAGNOSIS — Z0001 Encounter for general adult medical examination with abnormal findings: Secondary | ICD-10-CM | POA: Insufficient documentation

## 2023-01-21 LAB — VITAMIN D 25 HYDROXY (VIT D DEFICIENCY, FRACTURES): VITD: 30.99 ng/mL (ref 30.00–100.00)

## 2023-01-21 LAB — HEMOGLOBIN A1C: Hgb A1c MFr Bld: 6.1 % (ref 4.6–6.5)

## 2023-01-21 LAB — HSV(HERPES SIMPLEX VRS) I + II AB-IGG
HAV 1 IGG,TYPE SPECIFIC AB: <0.9 index
HSV 2 IGG,TYPE SPECIFIC AB: <0.9 index

## 2023-01-21 LAB — RPR: RPR Ser Ql: NONREACTIVE

## 2023-01-21 LAB — HEPATITIS C ANTIBODY: Hepatitis C Ab: NONREACTIVE

## 2023-01-21 LAB — HIV ANTIBODY (ROUTINE TESTING W REFLEX): HIV 1&2 Ab, 4th Generation: NONREACTIVE

## 2023-01-21 NOTE — Assessment & Plan Note (Signed)
She has been having ongoing right hip pain now for 2 months.  Will place referral to physical therapy.  Can continue Tylenol or ibuprofen as needed for pain.  Follow-up in 3 months.  Will do imaging if pain is not improving.

## 2023-01-21 NOTE — Assessment & Plan Note (Signed)
BMI 45.8.  Discussed nutrition, exercise.

## 2023-01-21 NOTE — Assessment & Plan Note (Signed)
Check A1c today and treat based on results.

## 2023-01-21 NOTE — Assessment & Plan Note (Signed)
Health maintenance reviewed and updated. Discussed nutrition, exercise.  Recent CMP, CBC reviewed.  Follow-up 1 year.

## 2023-01-21 NOTE — Assessment & Plan Note (Signed)
She has been taking the weekly vitamin D supplement 50,000 units.  Will recheck vitamin D levels today.

## 2023-01-22 LAB — URINE CYTOLOGY ANCILLARY ONLY
Chlamydia: NEGATIVE
Comment: NEGATIVE
Comment: NORMAL
Neisseria Gonorrhea: NEGATIVE

## 2023-01-24 ENCOUNTER — Telehealth: Payer: Self-pay | Admitting: Nurse Practitioner

## 2023-01-24 NOTE — Telephone Encounter (Signed)
Pt called back returning your call °

## 2023-01-24 NOTE — Telephone Encounter (Signed)
I spoke with patient and sent message regarding request for therapy location change.

## 2023-01-27 IMAGING — DX DG WRIST COMPLETE 3+V*R*
4 series · 4 of 4 positions shown · non-contrast
Comparison: None.

CLINICAL DATA: Right wrist pain

EXAM:
RIGHT WRIST - COMPLETE 3+ VIEW

[wrist pa]
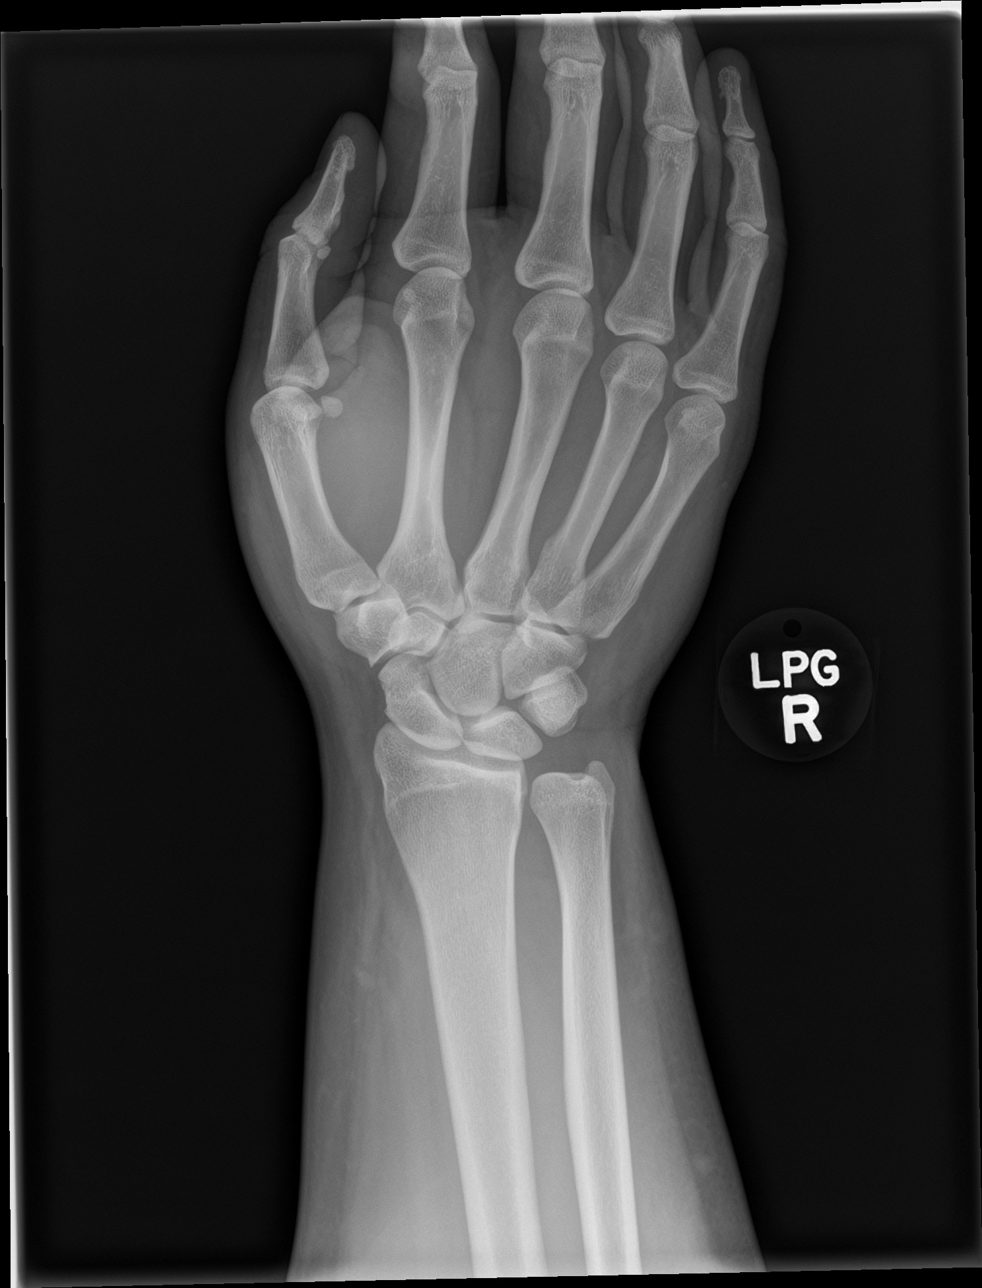

[wrist lat]
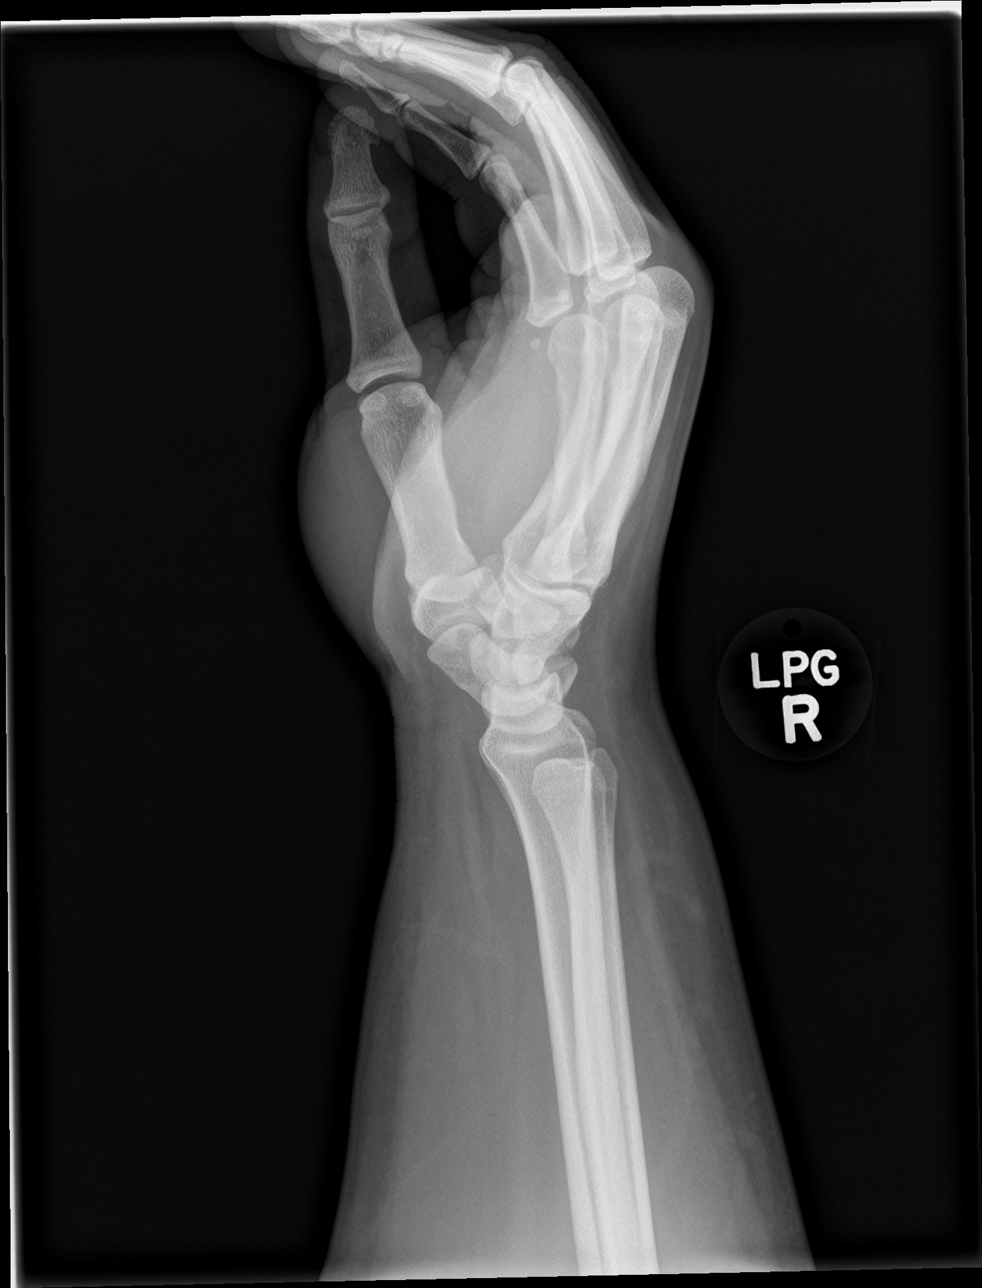

[wrist obl]
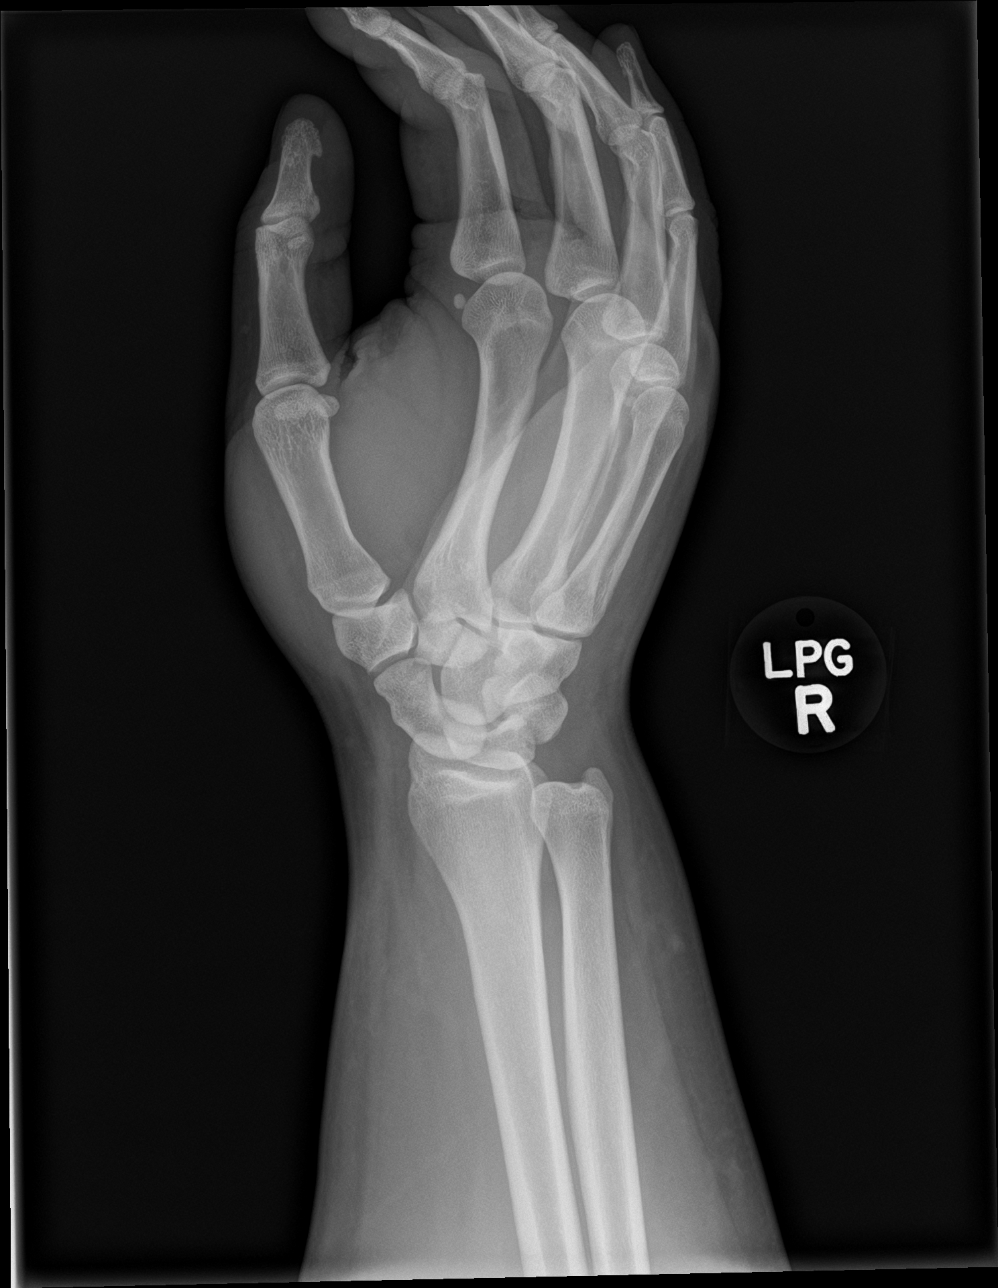

[scaphoid]
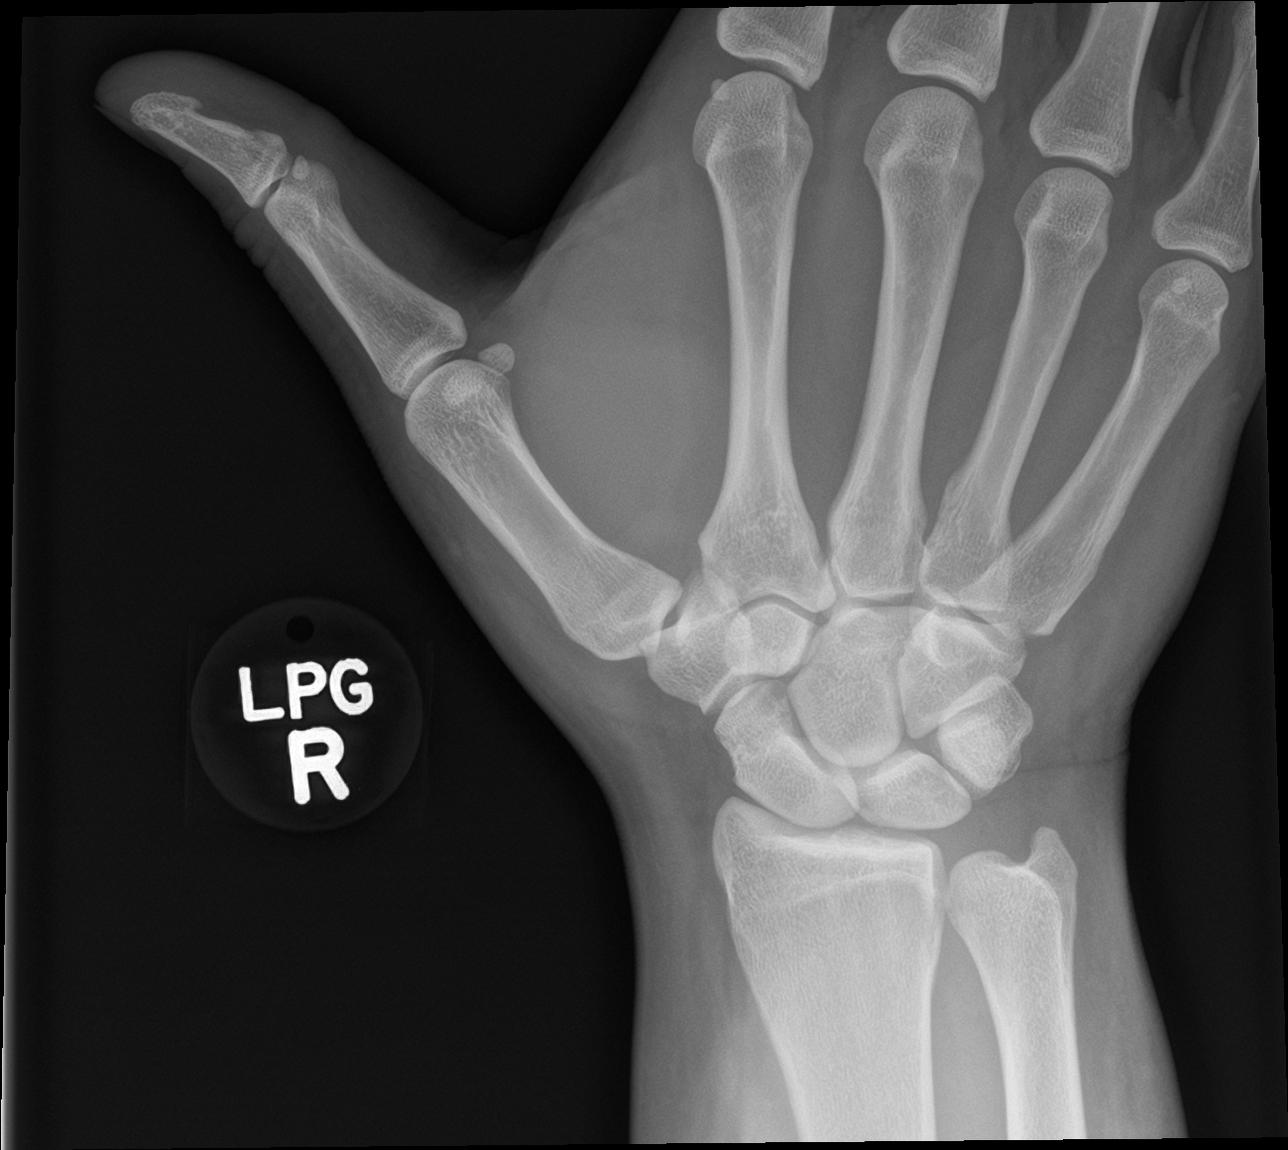

[4 of 4 positions shown; findings below may reference images not displayed]

FINDINGS: There is no acute fracture or dislocation. Bony alignment is normal.
The joint spaces are preserved. There is no erosive change. The soft
tissues are unremarkable.
IMPRESSION: No acute fracture or dislocation.

## 2023-02-28 ENCOUNTER — Other Ambulatory Visit: Payer: Self-pay | Admitting: Podiatry

## 2023-03-04 ENCOUNTER — Other Ambulatory Visit: Payer: Self-pay | Admitting: Podiatry

## 2023-03-14 ENCOUNTER — Telehealth: Payer: Self-pay | Admitting: Nurse Practitioner

## 2023-03-14 ENCOUNTER — Telehealth: Payer: Self-pay | Admitting: Podiatry

## 2023-03-14 MED ORDER — MELOXICAM 15 MG PO TABS: 15.0000 mg | ORAL_TABLET | Freq: Every day | ORAL | 1 refills | Status: DC

## 2023-03-14 NOTE — Telephone Encounter (Signed)
Prescription Request  03/14/2023  LOV: 01/20/2023  What is the name of the medication or equipment? meloxicam (MOBIC) 15 MG tablet   Have you contacted your pharmacy to request a refill? No   Which pharmacy would you like this sent to?  [id number] - [place], [place] - [address] AT [facility] [address] [place] [address] Phone: [phone] Fax: [phone]    Patient notified that their request is being sent to the clinical staff for review and that they should receive a response within 2 business days.   Please advise at Mobile [phone] (mobile)

## 2023-03-14 NOTE — Telephone Encounter (Signed)
Requesting: meloxicam (MOBIC) 15 MG tablet  Last Visit: 01/20/2023 Next Visit: 04/21/2023 Last Refill: 11/13/2021 by Ovid Curd, DPM  Please Advise

## 2023-03-14 NOTE — Telephone Encounter (Signed)
Pt called to get a refill on her meloxicam medication.   Upon checking she has not been seen since 6.7.2022 and I told her she would need an appt. I offered to make the appt and she said she will call back to schedule the appt.

## 2023-03-17 NOTE — Telephone Encounter (Signed)
LVM that Rx sent to pharmacy. 

## 2023-04-21 ENCOUNTER — Ambulatory Visit (INDEPENDENT_AMBULATORY_CARE_PROVIDER_SITE_OTHER): Payer: BC Managed Care – PPO | Admitting: Nurse Practitioner

## 2023-04-21 ENCOUNTER — Encounter: Payer: Self-pay | Admitting: Nurse Practitioner

## 2023-04-21 VITALS — BP 126/82 | HR 57 | Temp 98.0°F | Ht 68.0 in | Wt 301.4 lb

## 2023-04-21 DIAGNOSIS — R7303 Prediabetes: Secondary | ICD-10-CM

## 2023-04-21 DIAGNOSIS — M25551 Pain in right hip: Secondary | ICD-10-CM

## 2023-04-21 DIAGNOSIS — R0982 Postnasal drip: Secondary | ICD-10-CM

## 2023-04-21 DIAGNOSIS — R0683 Snoring: Secondary | ICD-10-CM

## 2023-04-21 LAB — POCT GLYCOSYLATED HEMOGLOBIN (HGB A1C)
HbA1c POC (<> result, manual entry): 5.7 % (ref 4.0–5.6)
HbA1c, POC (controlled diabetic range): 5.7 % (ref 0.0–7.0)
HbA1c, POC (prediabetic range): 5.7 % (ref 5.7–6.4)
Hemoglobin A1C: 5.7 % — AB (ref 4.0–5.6)

## 2023-04-21 NOTE — Assessment & Plan Note (Signed)
Chronic, stable.  A1c is 5.7% today.  Discussed that prediabetes is a form of insulin resistance.  Recommend limiting carbohydrates and increasing exercise as able.  Follow-up in 6 months or sooner with concerns.

## 2023-04-21 NOTE — Patient Instructions (Signed)
It was great to see you!  I recommend that you aim for 60-80 grams of protein daily and limit total carbs to less than 200 grams daily.   I have placed a referral to sleep medicine for a sleep study and allergy for allergy testing  Let's follow-up in 6 months, sooner if you have concerns.  If a referral was placed today, you will be contacted for an appointment. Please note that routine referrals can sometimes take up to 3-4 weeks to process. Please call our office if you haven't heard anything after this time frame.  Take care,  Rodman Pickle, NP

## 2023-04-21 NOTE — Assessment & Plan Note (Signed)
Chronic, improving.  Continue meloxicam 15 mg daily as needed and following with physical therapy.  Follow-up in 6 months or sooner if symptoms worsen or any concerns.

## 2023-04-21 NOTE — Progress Notes (Signed)
Established Patient Office Visit  Subjective   Patient ID: Brooke Dean, female    DOB: 02-09-1991  Age: 32 y.o. MRN: [id number]  Chief Complaint  Patient presents with   Hip Pain    Follow up, request a sleep study, sleeping issues, insulin resistance, allergy test   HPI:  Brooke Dean is here to follow-up on hip pain, prediabetes, and sleeping issues.  She states that her hip pain is getting better. She has been going to physical therapy which is helping with her flexibility and pain. She states that sitting in certain positions will make her hip get stiff. She takes meloxicam as needed.   She is also interested in a referral for a sleep study. She states that she snores, and doesn't sleep long. She states her dad has a history of sleep apnea. She does not feel refreshed when she wakes up in the mornings. She gets migraines on occasions.   She is still having ongoing post nasal drip. She has been taking claritin and flonase and it's not helping. She would like an allergy test.   She has been trying to exercise more. She does not eat sweets. She denies chest pain, urinary frequency,     ROS See pertinent positives and negatives per HPI.    Objective:     BP 126/82 (BP Location: Right Arm)   Pulse (!) 57   Temp 98 F (36.7 C)   Ht 5\' 8"  (1.727 m)   Wt (!) 301 lb 6.4 oz (136.7 kg)   SpO2 98%   BMI 45.83 kg/m  BP Readings from Last 3 Encounters:  04/21/23 126/82  01/20/23 124/82  10/21/22 121/70   Wt Readings from Last 3 Encounters:  04/21/23 (!) 301 lb 6.4 oz (136.7 kg)  01/20/23 (!) 301 lb 6.4 oz (136.7 kg)  10/21/22 (!) 301 lb 6.4 oz (136.7 kg)    Physical Exam Vitals and nursing note reviewed.  Constitutional:      General: She is not in acute distress.    Appearance: Normal appearance.  HENT:     Head: Normocephalic.  Eyes:     Conjunctiva/sclera: Conjunctivae normal.  Cardiovascular:     Rate and Rhythm: Normal rate and regular rhythm.      Pulses: Normal pulses.     Heart sounds: Normal heart sounds.  Pulmonary:     Effort: Pulmonary effort is normal.     Breath sounds: Normal breath sounds.  Musculoskeletal:     Cervical back: Normal range of motion.  Skin:    General: Skin is warm.  Neurological:     General: No focal deficit present.     Mental Status: She is alert and oriented to person, place, and time.  Psychiatric:        Mood and Affect: Mood normal.        Behavior: Behavior normal.        Thought Content: Thought content normal.        Judgment: Judgment normal.      Results for orders placed or performed in visit on 04/21/23  POCT glycosylated hemoglobin (Hb A1C)  Result Value Ref Range   Hemoglobin A1C 5.7 (A) 4.0 - 5.6 %   HbA1c POC (<> result, manual entry) 5.7 4.0 - 5.6 %   HbA1c, POC (prediabetic range) 5.7 5.7 - 6.4 %   HbA1c, POC (controlled diabetic range) 5.7 0.0 - 7.0 %      The ASCVD Risk score (Arnett DK, et  al., 2019) failed to calculate for the following reasons:   The 2019 ASCVD risk score is only valid for ages 18 to 15    Assessment & Plan:   Problem List Items Addressed This Visit       Other   Right hip pain    Chronic, improving.  Continue meloxicam 15 mg daily as needed and following with physical therapy.  Follow-up in 6 months or sooner if symptoms worsen or any concerns.      Prediabetes - Primary    Chronic, stable.  A1c is 5.7% today.  Discussed that prediabetes is a form of insulin resistance.  Recommend limiting carbohydrates and increasing exercise as able.  Follow-up in 6 months or sooner with concerns.      Relevant Orders   POCT glycosylated hemoglobin (Hb A1C) (Completed)   Other Visit Diagnoses     Snoring       She has been told that she snores and wakes up throughout the night.  Will place a referral for sleep medicine for sleep study.   Relevant Orders   Ambulatory referral to Sleep Studies   Post-nasal drip       Ongoing symptoms even with  Claritin and Flonase.  She would like allergy testing.  Referral placed to allergist.   Relevant Orders   Ambulatory referral to Allergy       Return in about 6 months (around 10/21/2023) for prediabetes, hip pain.    Gerre Scull, NP

## 2023-05-06 ENCOUNTER — Encounter: Payer: Self-pay | Admitting: Neurology

## 2023-05-06 ENCOUNTER — Ambulatory Visit (INDEPENDENT_AMBULATORY_CARE_PROVIDER_SITE_OTHER): Payer: BC Managed Care – PPO | Admitting: Neurology

## 2023-05-06 VITALS — BP 117/68 | HR 69 | Ht 68.0 in | Wt 303.8 lb

## 2023-05-06 DIAGNOSIS — R519 Headache, unspecified: Secondary | ICD-10-CM | POA: Diagnosis not present

## 2023-05-06 DIAGNOSIS — Z9189 Other specified personal risk factors, not elsewhere classified: Secondary | ICD-10-CM | POA: Diagnosis not present

## 2023-05-06 DIAGNOSIS — G4719 Other hypersomnia: Secondary | ICD-10-CM

## 2023-05-06 DIAGNOSIS — Z6841 Body Mass Index (BMI) 40.0 and over, adult: Secondary | ICD-10-CM

## 2023-05-06 DIAGNOSIS — Z820 Family history of epilepsy and other diseases of the nervous system: Secondary | ICD-10-CM

## 2023-05-06 DIAGNOSIS — R351 Nocturia: Secondary | ICD-10-CM

## 2023-05-06 NOTE — Progress Notes (Signed)
Subjective:    Patient ID: Brooke Dean is a 32 y.o. female.  HPI    Huston Foley, MD, PhD [facility] [address], [address] [address] [place], [place] [address]  Dear Leotis Shames,   I saw your patient, Brooke Dean, upon your kind request in my sleep clinic today for initial consultation of her sleep disorder, in particular, concern for underlying obstructive sleep apnea.  The patient is unaccompanied today.  As you know, Brooke Dean is a 32 year old female with an underlying medical history of prediabetes, allergies, anxiety, depression, hip pain, and severe obesity with a BMI of over 45, who reports sleep disruption and daytime somnolence.  Her Epworth sleepiness score is 12 out of 24, fatigue severity score is 43 out of 63.  She reports that her dad has a CPAP machine and a history of congestive heart failure.  She has had trouble maintaining sleep, sadly, she lost her mom in February 2024.  She lives with her dad and siblings, she shares her bedroom with her sister.  Patient works nights, about 5 hours, with variable schedule.  She works for [employer].  Bedtime is generally around 4 5 AM and rise time around noon.  She does not always sleep through.  She has occasional morning headaches and has nocturia about once per average night.  She had a tonsillectomy and adenoidectomy at age 87.  She drinks no daily caffeine, she drinks alcohol rarely, she is a non-smoker.  She does not watch TV in her bedroom but is on her phone at night.  They have no pets in the household. Her weight has been more or less stable.  Her Past Medical History Is Significant For: Past Medical History:  Diagnosis Date   Anxiety    Depression     Her Past Surgical History Is Significant For: Past Surgical History:  Procedure Laterality Date   ADENOIDECTOMY Bilateral    32yo   TONSILLECTOMY AND ADENOIDECTOMY     WISDOM TOOTH EXTRACTION      Her Family History Is Significant For: Family  History  Problem Relation Age of Onset   Lymphoma Mother    Edema Mother    Heart failure Father    Sleep apnea Father    Cancer Maternal Grandfather     Her Social History Is Significant For: Social History   Socioeconomic History   Marital status: Single    Spouse name: Not on file   Number of children: Not on file   Years of education: Not on file   Highest education level: Not on file  Occupational History   Not on file  Tobacco Use   Smoking status: Never   Smokeless tobacco: Never  Vaping Use   Vaping Use: Never used  Substance and Sexual Activity   Alcohol use: Not Currently    Comment: rare   Drug use: Not Currently    Comment: thc gummies   Sexual activity: Not Currently    Birth control/protection: Inserts  Other Topics Concern   Not on file  Social History Narrative   Not on file   Social Determinants of Health   Financial Resource Strain: Not on file  Food Insecurity: Not on file  Transportation Needs: Not on file  Physical Activity: Not on file  Stress: Not on file  Social Connections: Not on file    Her Allergies Are:  Allergies  Allergen Reactions   Other Swelling    Spider Bites-Swelling  :  Her Current Medications Are:  Outpatient Encounter Medications as of 05/06/2023  Medication Sig   etonogestrel-ethinyl estradiol (NUVARING) 0.12-0.015 MG/24HR vaginal ring Place vaginally.   fluticasone (FLONASE) 50 MCG/ACT nasal spray Place 2 sprays into both nostrils daily.   loratadine (CLARITIN) 10 MG tablet Take 1 tablet (10 mg total) by mouth daily.   meloxicam (MOBIC) 15 MG tablet Take 1 tablet (15 mg total) by mouth daily.   Vitamin D, Ergocalciferol, (DRISDOL) 1.25 MG (50000 UNIT) CAPS capsule Take 1 capsule (50,000 Units total) by mouth every 7 (seven) days.   No facility-administered encounter medications on file as of 05/06/2023.  :   Review of Systems:  Out of a complete 14 point review of systems, all are reviewed and negative with  the exception of these symptoms as listed below:  Review of Systems  Neurological:        Pt here for sleep consult Pt snores,fatigue,headaches  Pt denies sleep study,CPAP machine,hypertension    ESS:12 FSS:43     Objective:  Neurological Exam  Physical Exam Physical Examination:   Vitals:   05/06/23 1125  BP: 117/68  Pulse: 69    General Examination: The patient is a very pleasant 32 y.o. female in no acute distress. She appears well-developed and well-nourished and well groomed.   HEENT: Normocephalic, atraumatic, pupils are equal, round and reactive to light, extraocular tracking is good without limitation to gaze excursion or nystagmus noted.  Corrective eyeglasses in place.  Hearing is grossly intact. Face is symmetric with normal facial animation. Speech is clear with no dysarthria noted. There is no hypophonia. There is no lip, neck/head, jaw or voice tremor. Neck is supple with full range of passive and active motion. There are no carotid bruits on auscultation. Oropharynx exam reveals: mild mouth dryness, good dental hygiene, moderate airway crowding secondary to small airway entry, wider tongue noted.  Tonsils absent, Mallampati class II.  Neck circumference 17-1/2 inches.  She has no significant overbite.  Tongue protrudes centrally and palate elevates symmetrically.    Chest: Clear to auscultation without wheezing, rhonchi or crackles noted.  Heart: S1+S2+0, regular and normal without murmurs, rubs or gallops noted.   Abdomen: Soft, non-tender and non-distended.  Extremities: There is no pitting edema in the distal lower extremities bilaterally.   Skin: Warm and dry without trophic changes noted.   Musculoskeletal: exam reveals no obvious joint deformities.   Neurologically:  Mental status: The patient is awake, alert and oriented in all 4 spheres. Her immediate and remote memory, attention, language skills and fund of knowledge are appropriate. There is no  evidence of aphasia, agnosia, apraxia or anomia. Speech is clear with normal prosody and enunciation. Thought process is linear. Mood is constricted, affect blunted.   Cranial nerves II - XII are as described above under HEENT exam.  Motor exam: Normal bulk, strength and tone is noted. There is no obvious action or resting tremor.  Fine motor skills and coordination: grossly intact.  Cerebellar testing: No dysmetria or intention tremor. There is no truncal or gait ataxia.  Sensory exam: intact to light touch in the upper and lower extremities.  Gait, station and balance: She stands easily. No veering to one side is noted. No leaning to one side is noted. Posture is age-appropriate and stance is narrow based. Gait shows normal stride length and normal pace. No problems turning are noted.   Assessment and Plan:  In summary, IRLENE SODERQUIST is a very pleasant 32 y.o.-year old female  with an underlying medical history of prediabetes, allergies, anxiety, depression, hip pain, and severe obesity with a BMI of over 45, whose history and physical exam are concerning for sleep disordered breathing, particularly obstructive sleep apnea (OSA). A laboratory attended sleep study is typically considered "gold standard" for evaluation of sleep disordered breathing.   I had a long chat with the patient about my findings and the diagnosis of sleep apnea, particularly OSA, its prognosis and treatment options. We talked about medical/conservative treatments, surgical interventions and non-pharmacological approaches for symptom control. I explained, in particular, the risks and ramifications of untreated moderate to severe OSA, especially with respect to developing cardiovascular disease down the road, including congestive heart failure (CHF), difficult to treat hypertension, cardiac arrhythmias (particularly A-fib), neurovascular complications including TIA, stroke and dementia. Even type 2 diabetes has, in part, been  linked to untreated OSA. Symptoms of untreated OSA may include (but may not be limited to) daytime sleepiness, nocturia (i.e. frequent nighttime urination), memory problems, mood irritability and suboptimally controlled or worsening mood disorder such as depression and/or anxiety, lack of energy, lack of motivation, physical discomfort, as well as recurrent headaches, especially morning or nocturnal headaches. We talked about the importance of maintaining a healthy lifestyle and striving for healthy weight. In addition, we talked about the importance of striving for and maintaining good sleep hygiene.  She is encouraged to talk to you about grief as she has recently lost her mother, to discuss grief management, such as the possibility of grief counseling or medication management. I recommended a sleep study at this time. I outlined the differences between a laboratory attended sleep study which is considered more comprehensive and accurate over the option of a home sleep test (HST); the latter may lead to underestimation of sleep disordered breathing in some instances and does not help with diagnosing upper airway resistance syndrome and is not accurate enough to diagnose primary central sleep apnea typically. I outlined possible surgical and non-surgical treatment options of OSA, including the use of a positive airway pressure (PAP) device (i.e. CPAP, AutoPAP/APAP or BiPAP in certain circumstances), a custom-made dental device (aka oral appliance, which would require a referral to a specialist dentist or orthodontist typically, and is generally speaking not considered for patients with full dentures or edentulous state), upper airway surgical options, such as traditional UPPP (which is not considered a first-line treatment) or the Inspire device (hypoglossal nerve stimulator, which would involve a referral for consultation with an ENT surgeon, after careful selection, following inclusion criteria - also not  first-line treatment). I explained the PAP treatment option to the patient in detail, as this is generally considered first-line treatment.  The patient indicated that she would be willing to try PAP therapy, if the need arises. I explained the importance of being compliant with PAP treatment, not only for insurance purposes but primarily to improve patient's symptoms symptoms, and for the patient's long term health benefit, including to reduce Her cardiovascular risks longer-term.    We will pick up our discussion about the next steps and treatment options after testing.  We will keep her posted as to the test results by phone call and/or MyChart messaging where possible.  We will plan to follow-up in sleep clinic accordingly as well.  I answered all her questions today and the patient was in agreement.   I encouraged her to call with any interim questions, concerns, problems or updates or email [place] through MyChart.  Generally speaking, sleep test authorizations may take  up to 2 weeks, sometimes less, sometimes longer, the patient is encouraged to get in touch with [place] if they do not hear back from the sleep lab staff directly within the next 2 weeks.  Thank you very much for allowing me to participate in the care of this nice patient. If I can be of any further assistance to you please do not hesitate to call me at [phone].  Sincerely,   Huston Foley, MD, PhD

## 2023-05-06 NOTE — Patient Instructions (Signed)

## 2023-06-12 ENCOUNTER — Other Ambulatory Visit: Payer: Self-pay | Admitting: *Deleted

## 2023-06-12 DIAGNOSIS — R519 Headache, unspecified: Secondary | ICD-10-CM

## 2023-06-12 DIAGNOSIS — R351 Nocturia: Secondary | ICD-10-CM

## 2023-06-12 DIAGNOSIS — Z820 Family history of epilepsy and other diseases of the nervous system: Secondary | ICD-10-CM

## 2023-06-12 DIAGNOSIS — G4719 Other hypersomnia: Secondary | ICD-10-CM

## 2023-06-12 DIAGNOSIS — Z9189 Other specified personal risk factors, not elsewhere classified: Secondary | ICD-10-CM

## 2023-06-13 ENCOUNTER — Encounter: Payer: Self-pay | Admitting: Internal Medicine

## 2023-06-13 ENCOUNTER — Other Ambulatory Visit: Payer: Self-pay

## 2023-06-13 ENCOUNTER — Ambulatory Visit: Payer: BC Managed Care – PPO | Admitting: Internal Medicine

## 2023-06-13 VITALS — BP 120/70 | HR 66 | Temp 97.8°F | Resp 16 | Ht 66.93 in | Wt 302.1 lb

## 2023-06-13 DIAGNOSIS — J3081 Allergic rhinitis due to animal (cat) (dog) hair and dander: Secondary | ICD-10-CM | POA: Diagnosis not present

## 2023-06-13 DIAGNOSIS — J3089 Other allergic rhinitis: Secondary | ICD-10-CM | POA: Diagnosis not present

## 2023-06-13 DIAGNOSIS — R0982 Postnasal drip: Secondary | ICD-10-CM

## 2023-06-13 DIAGNOSIS — J301 Allergic rhinitis due to pollen: Secondary | ICD-10-CM | POA: Diagnosis not present

## 2023-06-13 DIAGNOSIS — J343 Hypertrophy of nasal turbinates: Secondary | ICD-10-CM

## 2023-06-13 MED ORDER — FLUTICASONE PROPIONATE 50 MCG/ACT NA SUSP: 2.0000 | Freq: Every day | NASAL | 5 refills | Status: DC

## 2023-06-13 MED ORDER — CETIRIZINE HCL 10 MG PO TABS: 10.0000 mg | ORAL_TABLET | Freq: Every day | ORAL | 5 refills | Status: AC

## 2023-06-13 MED ORDER — AZELASTINE HCL 0.1 % NA SOLN: 1.0000 | Freq: Two times a day (BID) | NASAL | 5 refills | Status: DC | PRN

## 2023-06-13 NOTE — Patient Instructions (Addendum)
Allergic Rhinitis: - Positive skin test 05/2023: grasses, weeds, molds, cats - Avoidance measures discussed. - Use nasal saline rinses before nose sprays such as with Neilmed Sinus Rinse.  Use distilled water.   - Use Flonase 2 sprays each nostril daily. Aim upward and outward. - Use Azelastine 1-2 sprays each nostril twice daily as needed for runny nose, drainage, sneezing, congestion. Aim upward and outward. - Use Zyrtec 10 mg daily.  - Consider allergy shots as long term control of your symptoms by teaching your immune system to be more tolerant of your allergy triggers   ALLERGEN AVOIDANCE MEASURES   Molds - Indoor avoidance Use air conditioning to reduce indoor humidity.  Do not use a humidifier. Keep indoor humidity at 30 - 40%.  Use a dehumidifier if needed. In the bathroom use an exhaust fan or open a window after showering.  Wipe down damp surfaces after showering.  Clean bathrooms with a mold-killing solution (diluted bleach, or products like Tilex, etc) at least once a month. In the kitchen use an exhaust fan to remove steam from cooking.  Throw away spoiled foods immediately, and empty garbage daily.  Empty water pans below self-defrosting refrigerators frequently. Vent the clothes dryer to the outside. Limit indoor houseplants; mold grows in the dirt.  No houseplants in the bedroom. Remove carpet from the bedroom. Encase the mattress and box springs with a zippered encasing.  Molds - Outdoor avoidance Avoid being outside when the grass is being mowed, or the ground is tilled. Avoid playing in leaves, pine straw, hay, etc.  Dead plant materials contain mold. Avoid going into barns or grain storage areas. Remove leaves, clippings and compost from around the home.  Pollen Avoidance Pollen levels are highest during the mid-day and afternoon.  Consider this when planning outdoor activities. Avoid being outside when the grass is being mowed, or wear a mask if the pollen-allergic  person must be the one to mow the grass. Keep the windows closed to keep pollen outside of the home. Use an air conditioner to filter the air. Take a shower, wash hair, and change clothing after working or playing outdoors during pollen season. Pet Dander Keep the pet out of your bedroom and restrict it to only a few rooms. Be advised that keeping the pet in only one room will not limit the allergens to that room. Don't pet, hug or kiss the pet; if you do, wash your hands with soap and water. High-efficiency particulate air (HEPA) cleaners run continuously in a bedroom or living room can reduce allergen levels over time. Regular use of a high-efficiency vacuum cleaner or a central vacuum can reduce allergen levels. Giving your pet a bath at least once a week can reduce airborne allergen.

## 2023-06-13 NOTE — Progress Notes (Signed)
NEW PATIENT  Date of Service/Encounter:  06/13/23  Consult requested by: Gerre Scull, NP   Subjective:   Brooke Dean (DOB: [birth year]) is a 32 y.o. female who presents to the clinic on 06/13/2023 with a chief complaint of Establish Care and Allergy Testing (Post nasal drip) .    History obtained from: chart review and patient.   Rhinitis:  Started years ago but worse the past few months. She did have a cold several months ago and has noticed worsening with it.  Symptoms include: nasal congestion, rhinorrhea, and post nasal drainage  Occurs year-round Potential triggers: not sure  Treatments tried:  Over the counter anti histamines Flonase PRN  Previous allergy testing: no History of reflux/heartburn:  History of sinus surgery: no Nonallergic triggers: none   Past Medical History: Past Medical History:  Diagnosis Date   Anxiety    Depression     Past Surgical History: Past Surgical History:  Procedure Laterality Date   ADENOIDECTOMY Bilateral    32yo   TONSILLECTOMY AND ADENOIDECTOMY     WISDOM TOOTH EXTRACTION      Family History: Family History  Problem Relation Age of Onset   Lymphoma Mother    Edema Mother    Allergic rhinitis Father    Heart failure Father    Sleep apnea Father    Eczema Sister    Other Sister    Allergic rhinitis Brother    Allergic rhinitis Brother    Cancer Maternal Grandfather     Social History:  Flooring in bedroom: carpet Pets: none Tobacco use/exposure: none Job: Academic librarian   Medication List:  Allergies as of 06/13/2023       Reactions   Other Swelling   Spider Bites-Swelling        Medication List        Accurate as of June 13, 2023 12:16 PM. If you have any questions, ask your nurse or doctor.          etonogestrel-ethinyl estradiol 0.12-0.015 MG/24HR vaginal ring Commonly known as: NUVARING Place vaginally.   fluticasone 50 MCG/ACT nasal spray Commonly known as:  FLONASE Place 2 sprays into both nostrils daily.   loratadine 10 MG tablet Commonly known as: CLARITIN Take 1 tablet (10 mg total) by mouth daily.   meloxicam 15 MG tablet Commonly known as: MOBIC Take 1 tablet (15 mg total) by mouth daily.   Vitamin D (Ergocalciferol) 1.25 MG (50000 UNIT) Caps capsule Commonly known as: DRISDOL Take 1 capsule (50,000 Units total) by mouth every 7 (seven) days.         REVIEW OF SYSTEMS: Pertinent positives and negatives discussed in HPI.   Objective:   Physical Exam: BP 120/70   Pulse 66   Temp 97.8 F (36.6 C) (Temporal)   Resp 16   Ht 5' 6.93" (1.7 m)   Wt (!) 302 lb 1.6 oz (137 kg)   SpO2 97%   BMI 47.42 kg/m  Body mass index is 47.42 kg/m. GEN: alert, well developed HEENT: clear conjunctiva, TM grey and translucent, nose with + inferior turbinate hypertrophy, boggy nasal mucosa, slight clear rhinorrhea, no cobblestoning HEART: regular rate and rhythm, no murmur LUNGS: clear to auscultation bilaterally, no coughing, unlabored respiration ABDOMEN: soft, non distended  SKIN: no rashes or lesions  Reviewed:  05/06/2023: saw Dr. Frances Furbish [facility]rology for sleep disorder, concern for OSA.  BMI 45 with sleep disruption and dyatime somnolence.  Plan to obtain sleep study.   04/21/2023: saw [facility]  NP for ongoing post nasal drip, despite Flonase/Claritin.  Discussed allergy testing and referral.  Also referral for sleep study for possible OSA.   01/20/2023: saw PCP for post nasal drip , started on Claritin/Flonase.  Also has preDM, morbid obesity, vit D Deficiency, hip pain.   Skin Testing:  Skin prick testing was placed, which includes aeroallergens/foods, histamine control, and saline control.  Verbal consent was obtained prior to placing test.  Patient tolerated procedure well.  Allergy testing results were read and interpreted by myself, documented by clinical staff. Adequate positive and negative control.  Results discussed with  patient/family.  Airborne Adult Perc - 06/13/23 1024     Time Antigen Placed 1029    Allergen Manufacturer Waynette Buttery    Location Back    Number of Test 55    1. Control-Buffer 50% Glycerol Negative    2. Control-Histamine 3+    3. Bahia 3+    4. French Southern Territories Negative    5. Johnson Negative    6. [place] Blue 3+    7. Meadow Fescue 3+    8. Perennial Rye 3+    9. Timothy 3+    10. Ragweed Mix Negative    11. Cocklebur Negative    12. Plantain,  English Negative    13. Baccharis Negative    14. Dog Fennel Negative    15. Russian Thistle Negative    16. Lamb's Quarters Negative    17. Sheep Sorrell Negative    18. Rough Pigweed Negative    19. Marsh Elder, Rough Negative    20. Mugwort, Common Negative    21. Box, Elder Negative    22. Cedar, red Negative    23. Sweet Gum Negative    24. Pecan Pollen Negative    25. Pine Mix Negative    26. Walnut, Black Pollen Negative    27. Red Mulberry Negative    28. Ash Mix Negative    29. Birch Mix Negative    30. Beech American Negative    31. Cottonwood, Guinea-Bissau Negative    32. Hickory, White Negative    33. Maple Mix Negative    34. Oak, [place] Mix Negative    35. Sycamore Eastern Negative    36. Alternaria Alternata Negative    37. Cladosporium Herbarum Negative    38. Aspergillus Mix Negative    39. Penicillium Mix Negative    40. Bipolaris Sorokiniana (Helminthosporium) Negative    41. Drechslera Spicifera (Curvularia) Negative    42. Mucor Plumbeus Negative    43. Fusarium Moniliforme Negative    44. Aureobasidium Pullulans (pullulara) Negative    45. Rhizopus Oryzae Negative    46. Botrytis Cinera Negative    47. Epicoccum Nigrum Negative    48. Phoma Betae Negative    49. Dust Mite Mix Negative    50. Cat Hair 10,000 BAU/ml Negative    51.  Dog Epithelia Negative    52. Mixed Feathers Negative    53. Horse Epithelia Negative    54. Cockroach, German Negative    55. Tobacco Leaf Negative             Intradermal  - 06/13/23 1148     Time Antigen Placed 1128    Allergen Manufacturer Waynette Buttery    Location Back    Number of Test 14    Control Negative    [place] Territ[place] 2+    Johnson 2+    Ragweed Mix Negative    Weed Mix 2+  Tree Mix Negative    Mold 1 2+    Mold 2 Negative    Mold 3 2+    Mold 4 2+    Mite Mix Negative    Cat 3+    Dog Negative    Cockroach Negative               Assessment:   1. Nasal turbinate hypertrophy   2. Non-seasonal allergic rhinitis due to pollen   3. Post-nasal drainage   4. Allergic rhinitis caused by mold   5. Allergic rhinitis due to animal hair or dander     Plan/Recommendations:  Allergic Rhinitis: - Due to turbinate hypertrophy and unresponsive to over the counter meds, performed skin testing to identify aeroallergen triggers.   - Positive skin test 05/2023: grasses, weeds, molds, cats - Avoidance measures discussed. - Use nasal saline rinses before nose sprays such as with Neilmed Sinus Rinse.  Use distilled water.   - Use Flonase 2 sprays each nostril daily. Aim upward and outward. - Use Azelastine 1-2 sprays each nostril twice daily as needed for runny nose, drainage, sneezing, congestion. Aim upward and outward. - Use Zyrtec 10 mg daily.  - Consider allergy shots as long term control of your symptoms by teaching your immune system to be more tolerant of your allergy triggers   Return in about 6 weeks (around 07/25/2023).  Alesia Morin, MD [facility]

## 2023-06-17 ENCOUNTER — Telehealth: Payer: Self-pay | Admitting: Neurology

## 2023-06-17 NOTE — Telephone Encounter (Signed)
Patient prefers a HST due to her work schedule.  MAIL OUT HST- [employer] no auth req spoke to [place] ref # [id number]   Patient is on the schedule for 06/25/2023.

## 2023-06-25 ENCOUNTER — Ambulatory Visit: Payer: BC Managed Care – PPO | Admitting: Neurology

## 2023-06-25 DIAGNOSIS — R519 Headache, unspecified: Secondary | ICD-10-CM

## 2023-06-25 DIAGNOSIS — G4733 Obstructive sleep apnea (adult) (pediatric): Secondary | ICD-10-CM

## 2023-06-25 DIAGNOSIS — Z820 Family history of epilepsy and other diseases of the nervous system: Secondary | ICD-10-CM

## 2023-06-25 DIAGNOSIS — R351 Nocturia: Secondary | ICD-10-CM

## 2023-06-25 DIAGNOSIS — Z9189 Other specified personal risk factors, not elsewhere classified: Secondary | ICD-10-CM

## 2023-06-25 DIAGNOSIS — G4719 Other hypersomnia: Secondary | ICD-10-CM

## 2023-07-02 ENCOUNTER — Telehealth: Payer: Self-pay | Admitting: Neurology

## 2023-07-02 NOTE — Telephone Encounter (Signed)
Please result when available  

## 2023-07-02 NOTE — Progress Notes (Signed)
See procedure note.

## 2023-07-02 NOTE — Telephone Encounter (Signed)
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

## 2023-07-04 NOTE — Addendum Note (Signed)
Addended by: Huston Foley on: 07/04/2023 12:07 PM   Modules accepted: Orders

## 2023-07-04 NOTE — Procedures (Signed)
   [facility]  HOME SLEEP TEST (Watch PAT) REPORT  -  Mail-out Device  STUDY DATE: 06/29/2023  DOB: 1991-03-18  MRN: [id number]  ORDERING CLINICIAN: Huston Foley, MD, PhD   REFERRING CLINICIAN: Norval Morton, Jake Church, NP   CLINICAL INFORMATION/HISTORY: 32 year old female with an underlying medical history of prediabetes, allergies, anxiety, depression, hip pain, and severe obesity with a BMI of over 45, who reports sleep disruption and daytime somnolence.   Epworth sleepiness score: 12/24.  BMI: 46.1 kg/m  FINDINGS:   Sleep Summary:   Total Recording Time (hours, min): 7 hours, 55 min  Total Sleep Time (hours, min):  6 hours, 55 min  Percent REM (%):    30%   Respiratory Indices:   Calculated pAHI (per hour):  57.4/hour         REM pAHI:    60.5/hour       NREM pAHI: 56.1/hour  Central pAHI: 2.8/hour  Oxygen Saturation Statistics:    Oxygen Saturation (%) Mean: 94%   Minimum oxygen saturation (%):                 86%   O2 Saturation Range (%): 86 - 98%    O2 Saturation (minutes) <=88%: 0.3 min  Pulse Rate Statistics:   Pulse Mean (bpm):    58/min    Pulse Range (42 - 88/min)   IMPRESSION: OSA (obstructive sleep apnea), severe  RECOMMENDATION:  This home sleep test demonstrates severe obstructive sleep apnea with a total AHI of 57.4/hour and O2 nadir of 86%.  Snoring was detected, in the mild to moderate range.  Treatment with positive airway pressure is highly recommended. The patient will be advised to proceed with an autoPAP titration/trial at home. A laboratory attended titration study can be considered in the future for optimization of treatment settings and to improve tolerance and compliance. Alternative treatment options are limited secondary to the severity of the patient's sleep disordered breathing, but may include surgical treatment with an implantable hypoglossal nerve stimulator (in carefully selected candidates, meeting criteria).   Concomitant weight loss is recommended, where clinically appropriate. Please note, that untreated obstructive sleep apnea may carry additional perioperative morbidity. Patients with significant obstructive sleep apnea should receive perioperative PAP therapy and the surgeons and particularly the anesthesiologist should be informed of the diagnosis and the severity of the sleep disordered breathing. The patient should be cautioned not to drive, work at heights, or operate dangerous or heavy equipment when tired or sleepy. Review and reiteration of good sleep hygiene measures should be pursued with any patient. Other causes of the patient's symptoms, including circadian rhythm disturbances, an underlying mood disorder, medication effect and/or an underlying medical problem cannot be ruled out based on this test. Clinical correlation is recommended.  The patient and her referring provider will be notified of the test results. The patient will be seen in follow up in sleep clinic at [facility].  I certify that I have reviewed the raw data recording prior to the issuance of this report in accordance with the standards of the American Academy of Sleep Medicine (AASM).  INTERPRETING PHYSICIAN:   Huston Foley, MD, PhD Medical Director, [facility]) Diplomat, ABPN (Neurology and Sleep)   [facility] [address] [place], [place] [address] [phone]

## 2023-07-07 ENCOUNTER — Telehealth: Payer: Self-pay | Admitting: *Deleted

## 2023-07-07 NOTE — Telephone Encounter (Signed)
-----   Message from Huston Foley sent at 07/04/2023 12:07 PM EDT ----- Urgent set up requested on PAP therapy, due to severe OSA. Patient referred by PCP, seen by me on 05/06/23, patient had a HST on 06/29/23.    Please call and notify the patient that the recent home sleep test showed obstructive sleep apnea in the severe range. I recommend treatment for this in the form of autoPAP, which means, that we don't have to bring her in for a sleep study with CPAP, but will let her start using a so called autoPAP machine at home, through a DME company (of her choice, or as per insurance requirement). The DME representative will fit the patient with a mask of choice, educate her on how to use the machine, how to put the mask on, etc. I have placed an order in the chart. Please send the order to a local DME, talk to patient, send report to referring MD. Please also reinforce the need for compliance with treatment. We will need a FU in sleep clinic for 10 weeks post-PAP set up, please arrange that with me or one of our NPs. Thanks,   Huston Foley, MD, PhD [facility])

## 2023-07-07 NOTE — Telephone Encounter (Signed)
Spoke to patient gave sleep study Pt aware per Dr. Frances Furbish pt is urgent set up  due to severe OSA Pt chose [facility] for DME Pt aware of insurance compliance. Gave pt f/u  appointment with Jessica,NP 09/2023 will forward sleep results to PCP  and send orders to [facility] this afternoon  Pt thanked me for calling

## 2023-07-08 ENCOUNTER — Other Ambulatory Visit: Payer: Self-pay | Admitting: *Deleted

## 2023-07-08 DIAGNOSIS — G4733 Obstructive sleep apnea (adult) (pediatric): Secondary | ICD-10-CM

## 2023-07-08 NOTE — Progress Notes (Signed)
Order for autoPAP redone, thanks.

## 2023-07-09 NOTE — Telephone Encounter (Signed)
Faxed over updated order to [facility] this afternoon

## 2023-07-09 NOTE — Telephone Encounter (Signed)
Coralee North from [facility] reports that they have the Rx for the CPAP, they need the numerical pressure settings, ph#[phone] this can be faxed to them at [phone]

## 2023-07-25 NOTE — Telephone Encounter (Signed)
Pt called wanting to know what the update is for her to be able to get her cpap machine.

## 2023-07-28 NOTE — Telephone Encounter (Signed)
[facility] is not quite open yet. I will try to call them again this morning to inquire.

## 2023-07-28 NOTE — Telephone Encounter (Signed)
I called [facility] to inquire. I was told they had called the pt 4 times at [phone] but couldn't reach her and they ended up closing the order. I have asked them to call the pt on her main number on chart which is [phone]. I was told they would have the scheduling team call pt today. I called the pt and let her know. I advised her to call us if she doesn't hear from [facility] today. She verbalized understanding and appreciation for the call.

## 2023-07-29 NOTE — Telephone Encounter (Signed)
I called pt to follow-up. She states she has been scheduled for cpap setup w/ [facility] tomorrow at 11 am. I informed pt to call [facility] 1st then [place] for any concerns she may have after cpap setup. She verbalized understanding and appreciation for the call. We will see her 10/13/23 for her initial follow-up appointment.

## 2023-08-01 ENCOUNTER — Ambulatory Visit (INDEPENDENT_AMBULATORY_CARE_PROVIDER_SITE_OTHER): Payer: BC Managed Care – PPO | Admitting: Internal Medicine

## 2023-08-01 ENCOUNTER — Encounter: Payer: Self-pay | Admitting: Internal Medicine

## 2023-08-01 ENCOUNTER — Other Ambulatory Visit: Payer: Self-pay

## 2023-08-01 VITALS — BP 110/60 | HR 60 | Temp 98.0°F | Resp 16 | Ht 68.0 in | Wt 316.0 lb

## 2023-08-01 DIAGNOSIS — J302 Other seasonal allergic rhinitis: Secondary | ICD-10-CM

## 2023-08-01 DIAGNOSIS — J3089 Other allergic rhinitis: Secondary | ICD-10-CM

## 2023-08-01 DIAGNOSIS — R609 Edema, unspecified: Secondary | ICD-10-CM | POA: Diagnosis not present

## 2023-08-01 MED ORDER — AZELASTINE HCL 0.1 % NA SOLN: 1.0000 | Freq: Two times a day (BID) | NASAL | 5 refills | Status: DC | PRN

## 2023-08-01 MED ORDER — FLUTICASONE PROPIONATE 50 MCG/ACT NA SUSP: 2.0000 | Freq: Every day | NASAL | 5 refills | Status: DC

## 2023-08-01 NOTE — Progress Notes (Signed)
FOLLOW UP Date of Service/Encounter:  08/01/23   Subjective:  Brooke Dean (DOB: January 29, 1991) is a 32 y.o. female who [facility] for follow up for allergic rhinoconjunctivitis.   History obtained from: chart review and patient. Last visit was with me on 06/13/2023 for rhinitis, SPT positive to pollens, molds, cats.  Started on Flonase, Azelastine, Zyrtec.  Since last visit, she reports improvement in symptoms.  Not as much congestion, runny nose, post nasal drip. Still sometimes has drainage but improved.  Using Flonase and Azelastine almost daily.  Takes Zyrtec daily.   Was also wondering if she could do food testing; has trouble with inflammation and when I asked what she meant, she reported fluid retention.   Past Medical History: Past Medical History:  Diagnosis Date   Anxiety    Depression     Objective:  BP 110/60 (BP Location: Right Arm, Patient Position: Sitting, Cuff Size: Large)   Pulse 60   Temp 98 F (36.7 C) (Temporal)   Resp 16   Ht 5\' 8"  (1.727 m)   Wt (!) 316 lb (143.3 kg)   SpO2 96%   BMI 48.05 kg/m  Body mass index is 48.05 kg/m. Physical Exam: GEN: alert, well developed HEENT: clear conjunctiva, TM grey and translucent, nose with moderate inferior turbinate hypertrophy, pink nasal mucosa, clear rhinorrhea,no cobblestoning HEART: regular rate and rhythm, no murmur LUNGS: clear to auscultation bilaterally, no coughing, unlabored respiration SKIN: no rashes or lesions   Assessment:   1. Seasonal and perennial allergic rhinitis   2. Fluid retention     Plan/Recommendations:  Allergic Rhinitis: - Improved with combination of nose sprays and oral anti histamines.  - Positive skin test 05/2023: grasses, weeds, molds, cats - Avoidance measures discussed. - Use nasal saline rinses before nose sprays such as with Neilmed Sinus Rinse.  Use distilled water.   - Use Flonase 2 sprays each nostril daily. Aim  upward and outward. - Use Azelastine 1-2 sprays each nostril twice daily as needed for runny nose, drainage, sneezing, congestion. Aim upward and outward. - Use Zyrtec 10 mg daily.  - Consider allergy shots as long term control of your symptoms by teaching your immune system to be more tolerant of your allergy triggers.  Fluid Retention/Inflammation - Discussed food testing is not indicated for this; we only have food testing for IgE mediated food allergies and her reactions are not consistent with this.   - Discuss further with primary care. Also recommend low sodium diet.    Return in about 6 months (around 01/29/2024).  Alesia Morin, MD [facility]

## 2023-08-01 NOTE — Patient Instructions (Addendum)
Allergic Rhinitis: - Positive skin test 05/2023: grasses, weeds, molds, cats - Avoidance measures discussed. - Use nasal saline rinses before nose sprays such as with Neilmed Sinus Rinse.  Use distilled water.   - Use Flonase 2 sprays each nostril daily. Aim upward and outward. - Use Azelastine 1-2 sprays each nostril twice daily as needed for runny nose, drainage, sneezing, congestion. Aim upward and outward. - Use Zyrtec 10 mg daily.  - Consider allergy shots as long term control of your symptoms by teaching your immune system to be more tolerant of your allergy triggers.  Fluid Retention/Inflammation - Discussed food testing is not indicated for this; we only have food testing for IgE mediated food allergies and her reactions are not consistent with this.

## 2023-10-13 ENCOUNTER — Ambulatory Visit (INDEPENDENT_AMBULATORY_CARE_PROVIDER_SITE_OTHER): Payer: BC Managed Care – PPO | Admitting: Adult Health

## 2023-10-13 ENCOUNTER — Encounter: Payer: Self-pay | Admitting: Adult Health

## 2023-10-13 VITALS — BP 141/71 | HR 69 | Ht 68.0 in | Wt 332.0 lb

## 2023-10-13 DIAGNOSIS — G4700 Insomnia, unspecified: Secondary | ICD-10-CM

## 2023-10-13 DIAGNOSIS — G4733 Obstructive sleep apnea (adult) (pediatric): Secondary | ICD-10-CM | POA: Diagnosis not present

## 2023-10-13 NOTE — Progress Notes (Signed)
[facility] [address] [place]. [place] [address] [phone]       OFFICE FOLLOW UP NOTE  Ms. Brooke Dean Date of Birth:  04/16/1991 Medical Record Number:  [id number]    Primary neurologist: Dr. Frances Furbish Reason for visit: Initial CPAP follow-up    SUBJECTIVE:   CHIEF COMPLAINT:  Chief Complaint  Patient presents with   Follow-up    Here for initial cpap follow up. Overall machine working ok. She states that she can fall asleep no problem but even with using the CPAP she is still waking up. States she could still take a nap.     Follow-up visit:   Brief HPI:   Brooke Dean is a 32 y.o. female who was evaluated by Dr. Frances Furbish on 05/06/2023 for concern of underlying sleep apnea who reports sleep disruption and daytime somnolence.  ESS 12/24. FSS 43/63.  HST 06/25/2023 showed severe sleep apnea with total AHI of 57.4/h and O2 nadir of 86%.  AutoPap set 07/30/2023.    Interval history:  CPAP compliance report shows excellent compliance and optimal residual AHI. Using full face mask, tolerating well. Continues to have issues with staying asleep, continued daytime somnolence. No significant improvement on CPAP. Will drink coffee or coke to help sleep as caffeine makes her sleepy, reports her mother was the same way. Has never tried melatonin or other medications to help with insomnia. ESS 12/24. FSS 36/63.           ROS:   14 system review of systems performed and negative with exception of those listed in HPI  PMH:  Past Medical History:  Diagnosis Date   Anxiety    Depression     PSH:  Past Surgical History:  Procedure Laterality Date   ADENOIDECTOMY Bilateral    32yo   TONSILLECTOMY AND ADENOIDECTOMY     WISDOM TOOTH EXTRACTION      Social History:  Social History   Socioeconomic History   Marital status: Single    Spouse name: Not on file   Number of children: Not on file   Years of education: Not on file   Highest  education level: Not on file  Occupational History   Not on file  Tobacco Use   Smoking status: Never    Passive exposure: Past   Smokeless tobacco: Never  Vaping Use   Vaping status: Never Used  Substance and Sexual Activity   Alcohol use: Not Currently    Comment: rare   Drug use: Not Currently    Comment: thc gummies   Sexual activity: Not Currently    Birth control/protection: Inserts  Other Topics Concern   Not on file  Social History Narrative   Not on file   Social Determinants of Health   Financial Resource Strain: Not on file  Food Insecurity: Unknown (06/06/2021)   Received from [facility], [facility]   Hunger Vital Sign    Worried About Running Out of Food in the Last Year: Patient declined    Ran Out of Food in the Last Year: Patient declined  Transportation Needs: Not on file  Physical Activity: Not on file  Stress: Not on file  Social Connections: Unknown (04/02/2022)   Received from [facility], [facility]   Social Network    Social Network: Not on file  Intimate Partner Violence: Unknown (02/22/2022)   Received from [facility], [facility]   HITS    Physically Hurt: Not on file    Insult  or Talk Down To: Not on file    Threaten Physical Harm: Not on file    Scream or Curse: Not on file    Family History:  Family History  Problem Relation Age of Onset   Lymphoma Mother    Edema Mother    Allergic rhinitis Father    Heart failure Father    Sleep apnea Father    Eczema Sister    Other Sister    Allergic rhinitis Brother    Allergic rhinitis Brother    Cancer Maternal Grandfather     Medications:   Current Outpatient Medications on File Prior to Visit  Medication Sig Dispense Refill   azelastine (ASTELIN) 0.1 % nasal spray Place 1 spray into both nostrils 2 (two) times daily as needed. Use in each nostril as directed 30 mL 5   cetirizine (ZYRTEC ALLERGY) 10 MG tablet Take 1 tablet (10 mg total) by mouth daily. 30 tablet 5    etonogestrel-ethinyl estradiol (NUVARING) 0.12-0.015 MG/24HR vaginal ring Place vaginally.     fluticasone (FLONASE) 50 MCG/ACT nasal spray Place 2 sprays into both nostrils daily. 16 g 5   meloxicam (MOBIC) 15 MG tablet Take 1 tablet (15 mg total) by mouth daily. 30 tablet 1   No current facility-administered medications on file prior to visit.    Allergies:   Allergies  Allergen Reactions   Other Swelling    Spider Bites-Swelling      OBJECTIVE:  Physical Exam  Vitals:   10/13/23 1239  BP: (!) 141/71  Pulse: 69  Weight: (!) 332 lb (150.6 kg)  Height: 5\' 8"  (1.727 m)   Body mass index is 50.48 kg/m. No results found.   General: Morbidly obese pleasant middle-aged African-American female, seated, in no evident distress  Neurologic Exam Mental Status: Awake and fully alert. Oriented to place and time. Recent and remote memory intact. Attention span, concentration and fund of knowledge appropriate. Mood and affect appropriate.  Cranial Nerves: Pupils equal, briskly reactive to light. Extraocular movements full without nystagmus. Visual fields full to confrontation. Hearing intact. Facial sensation intact. Face, tongue, palate moves normally and symmetrically.  Motor: Normal bulk and tone. Normal strength in all tested extremity muscles Gait and Station: Arises from chair without difficulty. Stance is normal. Gait demonstrates normal stride length and balance without use of AD. Tandem walk and heel toe without difficulty.  Reflexes: 1+ and symmetric. Toes downgoing.         ASSESSMENT/PLAN: Brooke Dean is a 32 y.o. year old female    OSA on CPAP : Compliance report shows satisfactory usage with optimal residual AHI.  Continue current pressure settings.  Discussed continued nightly usage with ensuring greater than 4 hours nightly for optimal benefit and per insurance purposes.  Continue to follow with DME company for any needed supplies or CPAP related  concerns Insomnia: encouraged trial of melatonin but if no benefit, advised to further discuss with PCP, suspect continued daytime fatigue likely more coming from continued insomnia versus sleep apnea as her apnea is currently well treated.      Follow up in 6 months or call earlier if needed   CC:  PCP: Rodman Pickle A, NP    I spent 25 minutes of face-to-face and non-face-to-face time with patient.  This included previsit chart review, lab review, study review, order entry, electronic health record documentation, patient education and discussion regarding above diagnoses and treatment plan and answered all other questions to patient's satisfaction  Ihor Austin,  AGNP-BC  [facility] [address] [place], [place] [address]  Phone [phone] Fax [phone] Note: This document was prepared with digital dictation and possible smart phrase technology. Any transcriptional errors that result from this process are unintentional.

## 2023-10-13 NOTE — Patient Instructions (Addendum)
Your Plan:  Continue nightly use of CPAP for adequate sleep apnea management  Continue to follow with DME [facility] for any needed supplies or CPAP related concerns   Try melatonin which can be purchased over the counter - this can help sleep throughout the night. This does not help, please follow up with your PCP for other treatment options      Follow up in 6 months or call earlier if needed     Thank you for coming to see [place] at [facility]. I hope we have been able to provide you high quality care today.  You may receive a patient satisfaction survey over the next few weeks. We would appreciate your feedback and comments so that we may continue to improve ourselves and the health of our patients.

## 2023-10-22 ENCOUNTER — Ambulatory Visit: Payer: BC Managed Care – PPO | Admitting: Nurse Practitioner

## 2023-10-22 ENCOUNTER — Encounter: Payer: Self-pay | Admitting: Nurse Practitioner

## 2023-10-22 VITALS — BP 122/88 | HR 70 | Temp 97.3°F | Ht 68.0 in | Wt 332.0 lb

## 2023-10-22 DIAGNOSIS — F5101 Primary insomnia: Secondary | ICD-10-CM

## 2023-10-22 DIAGNOSIS — G4733 Obstructive sleep apnea (adult) (pediatric): Secondary | ICD-10-CM

## 2023-10-22 DIAGNOSIS — E559 Vitamin D deficiency, unspecified: Secondary | ICD-10-CM | POA: Diagnosis not present

## 2023-10-22 DIAGNOSIS — M25551 Pain in right hip: Secondary | ICD-10-CM

## 2023-10-22 DIAGNOSIS — R7303 Prediabetes: Secondary | ICD-10-CM

## 2023-10-22 LAB — CBC WITH DIFFERENTIAL/PLATELET
Basophils Absolute: 0 10*3/uL (ref 0.0–0.1)
Basophils Relative: 0.5 % (ref 0.0–3.0)
Eosinophils Absolute: 0.1 10*3/uL (ref 0.0–0.7)
Eosinophils Relative: 1.6 % (ref 0.0–5.0)
HCT: 39.9 % (ref 36.0–46.0)
Hemoglobin: 13.4 g/dL (ref 12.0–15.0)
Lymphocytes Relative: 47.7 % — ABNORMAL HIGH (ref 12.0–46.0)
Lymphs Abs: 3.5 10*3/uL (ref 0.7–4.0)
MCHC: 33.6 g/dL (ref 30.0–36.0)
MCV: 92.6 fL (ref 78.0–100.0)
Monocytes Absolute: 0.5 10*3/uL (ref 0.1–1.0)
Monocytes Relative: 6.9 % (ref 3.0–12.0)
Neutro Abs: 3.2 10*3/uL (ref 1.4–7.7)
Neutrophils Relative %: 43.3 % (ref 43.0–77.0)
Platelets: 306 10*3/uL (ref 150.0–400.0)
RBC: 4.31 Mil/uL (ref 3.87–5.11)
RDW: 13 % (ref 11.5–15.5)
WBC: 7.4 10*3/uL (ref 4.0–10.5)

## 2023-10-22 LAB — BASIC METABOLIC PANEL
BUN: 11 mg/dL (ref 6–23)
CO2: 26 meq/L (ref 19–32)
Calcium: 9.5 mg/dL (ref 8.4–10.5)
Chloride: 106 meq/L (ref 96–112)
Creatinine, Ser: 0.81 mg/dL (ref 0.40–1.20)
GFR: 97.48 mL/min (ref >60.00)
Glucose, Bld: 102 mg/dL — ABNORMAL HIGH (ref 70–99)
Potassium: 4.2 meq/L (ref 3.5–5.1)
Sodium: 138 meq/L (ref 135–145)

## 2023-10-22 LAB — VITAMIN D 25 HYDROXY (VIT D DEFICIENCY, FRACTURES): VITD: 18.85 ng/mL — ABNORMAL LOW (ref 30.00–100.00)

## 2023-10-22 LAB — HEMOGLOBIN A1C: Hgb A1c MFr Bld: 6.3 % (ref 4.6–6.5)

## 2023-10-22 MED ORDER — VITAMIN D (ERGOCALCIFEROL) 1.25 MG (50000 UNIT) PO CAPS: 50000.0000 [IU] | ORAL_CAPSULE | ORAL | 0 refills | Status: DC

## 2023-10-22 NOTE — Addendum Note (Signed)
Addended by: Rodman Pickle A on: 10/22/2023 04:43 PM   Modules accepted: Orders

## 2023-10-22 NOTE — Patient Instructions (Addendum)
It was great to see you!  We are checking your labs today and will let you know the results via mychart/phone.   You can try supplements to help with sleep (not all at once): - magnesium - camomile tea - sleepy time tea - valerian root - ashwaghanda  Start taking a vitamin D supplement 2,000 units daily, you can get this at any pharmacy.   [facility]   Let's follow-up in 6 months, sooner if you have concerns.  If a referral was placed today, you will be contacted for an appointment. Please note that routine referrals can sometimes take up to 3-4 weeks to process. Please call our office if you haven't heard anything after this time frame.  Take care,  Rodman Pickle, NP

## 2023-10-22 NOTE — Assessment & Plan Note (Signed)
Chronic, stable. Well controlled with CPAP. Continue following with sleep medicine.

## 2023-10-22 NOTE — Assessment & Plan Note (Addendum)
Chronic, stable. Check BMP, CBC, A1c today and treat based on results.

## 2023-10-22 NOTE — Assessment & Plan Note (Addendum)
Chronic, stable. Check vitamin D levels today and treat based on results. She is not currently taking a supplement.

## 2023-10-22 NOTE — Assessment & Plan Note (Signed)
She stopped physical therapy due to financial constraints but is considering resuming. We recommend resuming physical therapy at [facility] when feasible.

## 2023-10-22 NOTE — Assessment & Plan Note (Signed)
Despite using CPAP, she still faces challenges staying asleep. We discussed trying magnesium, chamomile tea, valerian root, and ashwagandha to improve sleep quality. Should these holistic methods prove ineffective, we will consider over-the-counter options like Unisom or Z-Quil, and possibly prescription sleep aids if needed.

## 2023-10-22 NOTE — Assessment & Plan Note (Addendum)
BMI 50.5.  Discussed nutrition, exercise. She expressed interest in a liquid diet for weight loss. We advised against a long-term liquid diet, suggesting a maximum of one week if she chooses to proceed.

## 2023-10-22 NOTE — Progress Notes (Signed)
Established Patient Office Visit  Subjective   Patient ID: Brooke Dean, female    DOB: [birth year]  Age: 32 y.o. MRN: [id number]  Chief Complaint  Patient presents with   Prediabetes, Hip Pain    Follow up, request lab work, trouble staying asleep    HPI  Discussed the use of AI scribe software for clinical note transcription with the patient, who gave verbal consent to proceed.  History of Present Illness   The patient, with a history of sleep apnea, presents with ongoing insomnia despite using a CPAP machine. She reports difficulty staying asleep and has tried melatonin and tart cherry juice without success. She expresses a preference for holistic remedies over medication.  The patient also reports concerns about her blood sugar levels, indicating a desire to avoid prediabetes. She has been monitoring her diet and sugar intake, particularly in relation to the tart cherry juice she has been consuming for her insomnia.  In addition to her sleep issues, the patient has been experiencing hip pain. She had previously been attending physical therapy but had to stop due to financial constraints. She expresses a desire to return to physical therapy and believes she can arrange this without needing a new referral.  The patient also mentions concerns about weight gain, particularly around her belly area. She expresses interest in trying a liquid diet or fast to kickstart weight loss, but is advised against doing this for more than a week.        ROS See pertinent positives and negatives per HPI.    Objective:     BP 122/88 (BP Location: Left Arm)   Pulse 70   Temp (!) 97.3 F (36.3 C)   Ht 5\' 8"  (1.727 m)   Wt (!) 332 lb (150.6 kg)   SpO2 98%   BMI 50.48 kg/m    Physical Exam Vitals and nursing note reviewed.  Constitutional:      General: She is not in acute distress.    Appearance: Normal appearance.  HENT:     Head: Normocephalic.  Eyes:     Conjunctiva/sclera:  Conjunctivae normal.  Cardiovascular:     Rate and Rhythm: Normal rate and regular rhythm.     Pulses: Normal pulses.     Heart sounds: Normal heart sounds.  Pulmonary:     Effort: Pulmonary effort is normal.     Breath sounds: Normal breath sounds.  Musculoskeletal:     Cervical back: Normal range of motion.  Skin:    General: Skin is warm.  Neurological:     General: No focal deficit present.     Mental Status: She is alert and oriented to person, place, and time.  Psychiatric:        Mood and Affect: Mood normal.        Behavior: Behavior normal.        Thought Content: Thought content normal.        Judgment: Judgment normal.      Assessment & Plan:   Problem List Items Addressed This Visit       Respiratory   OSA (obstructive sleep apnea)    Chronic, stable. Well controlled with CPAP. Continue following with sleep medicine.         Other   Right hip pain    She stopped physical therapy due to financial constraints but is considering resuming. We recommend resuming physical therapy at [facility] when feasible.      Relevant Orders   Ambulatory  referral to Physical Therapy   Morbid obesity (HCC)    BMI 50.5.  Discussed nutrition, exercise. She expressed interest in a liquid diet for weight loss. We advised against a long-term liquid diet, suggesting a maximum of one week if she chooses to proceed.      Prediabetes - Primary    Chronic, stable. Check BMP, CBC, A1c today and treat based on results.       Relevant Orders   Basic metabolic panel   CBC with Differential/Platelet   Hemoglobin A1c   Vitamin D deficiency    Chronic, stable. Check vitamin D levels today and treat based on results. She is not currently taking a supplement.       Relevant Orders   VITAMIN D 25 Hydroxy (Vit-D Deficiency, Fractures)   Primary insomnia    Despite using CPAP, she still faces challenges staying asleep. We discussed trying magnesium, chamomile tea, valerian root,  and ashwagandha to improve sleep quality. Should these holistic methods prove ineffective, we will consider over-the-counter options like Unisom or Z-Quil, and possibly prescription sleep aids if needed.       Return in about 6 months (around 04/21/2024) for CPE.    Gerre Scull, NP

## 2024-01-30 ENCOUNTER — Other Ambulatory Visit: Payer: Self-pay

## 2024-01-30 ENCOUNTER — Ambulatory Visit: Payer: BC Managed Care – PPO | Admitting: Internal Medicine

## 2024-01-30 ENCOUNTER — Encounter: Payer: Self-pay | Admitting: Internal Medicine

## 2024-01-30 VITALS — BP 118/80 | HR 70 | Temp 98.8°F | Ht 67.32 in | Wt 341.1 lb

## 2024-01-30 DIAGNOSIS — J302 Other seasonal allergic rhinitis: Secondary | ICD-10-CM | POA: Diagnosis not present

## 2024-01-30 DIAGNOSIS — J3089 Other allergic rhinitis: Secondary | ICD-10-CM

## 2024-01-30 MED ORDER — AZELASTINE HCL 0.1 % NA SOLN: 2.0000 | Freq: Two times a day (BID) | NASAL | 11 refills | Status: AC | PRN

## 2024-01-30 MED ORDER — FLUTICASONE PROPIONATE 50 MCG/ACT NA SUSP: 2.0000 | Freq: Every day | NASAL | 11 refills | Status: AC

## 2024-01-30 NOTE — Progress Notes (Signed)
   FOLLOW UP Date of Service/Encounter:  01/30/24   Subjective:  Brooke Dean (DOB: 03/26/1991) is a 33 y.o. female who [facility] for follow up for allergic rhino conjunctivitis.   History obtained from: chart review and patient. Last visit was with me on 08/01/2023 and at the time was doing well on Flonase/Azelastine/Zyrtec. Also discussed fluid retention is not a symptom of food allergies and does not need testing.  Reports having mild drainage but nothing too bothersome.  Not much congestion, rhinorrhea or sneezing recently.  Taking Flonase and Azelastine PRN; takes [employer] daily.  She was asking about Candida testing for body infection.  Discussed we do not do candida testing and that she does not need this as patients with Candida bloodstream infections are very sick in ICU.    Past Medical History: Past Medical History:  Diagnosis Date   Anxiety    Depression     Objective:  BP 118/80 (BP Location: Left Arm, Patient Position: Sitting, Cuff Size: Large)   Pulse 70   Temp 98.8 F (37.1 C) (Temporal)   Ht 5' 7.32" (1.71 m)   Wt (!) 341 lb 1.6 oz (154.7 kg)   SpO2 97%   BMI 52.91 kg/m  Body mass index is 52.91 kg/m. Physical Exam: GEN: alert, well developed HEENT: clear conjunctiva, nose with mild inferior turbinate hypertrophy, pink nasal mucosa, no rhinorrhea, no cobblestoning HEART: regular rate and rhythm, no murmur LUNGS: clear to auscultation bilaterally, no coughing, unlabored respiration SKIN: no rashes or lesions  Assessment:   1. Seasonal and perennial allergic rhinitis     Plan/Recommendations:  Allergic Rhinitis: - Well controlled.  - Positive skin test 05/2023: grasses, weeds, molds, cats - Use nasal saline rinses before nose sprays such as with Neilmed Sinus Rinse.  Use distilled water.   - Use Flonase 2 sprays each nostril daily. Aim upward and outward. - Use Azelastine 2 sprays each nostril  twice daily as needed for runny nose, drainage, sneezing, congestion. Aim upward and outward. - Use Zyrtec 10 mg daily.  - Consider allergy shots as long term control of your symptoms by teaching your immune system to be more tolerant of your allergy triggers.     Return in about 1 year (around 01/29/2025).  Alesia Morin, MD [facility]

## 2024-01-30 NOTE — Patient Instructions (Addendum)
 Allergic Rhinitis: - Positive skin test 05/2023: grasses, weeds, molds, cats - Use nasal saline rinses before nose sprays such as with Neilmed Sinus Rinse.  Use distilled water.   - Use Flonase 2 sprays each nostril daily. Aim upward and outward. - Use Azelastine 2 sprays each nostril twice daily as needed for runny nose, drainage, sneezing, congestion. Aim upward and outward. - Use Zyrtec 10 mg daily.  - Consider allergy shots as long term control of your symptoms by teaching your immune system to be more tolerant of your allergy triggers.

## 2024-04-21 ENCOUNTER — Ambulatory Visit (INDEPENDENT_AMBULATORY_CARE_PROVIDER_SITE_OTHER): Payer: BC Managed Care – PPO | Admitting: Nurse Practitioner

## 2024-04-21 ENCOUNTER — Encounter: Payer: Self-pay | Admitting: Nurse Practitioner

## 2024-04-21 ENCOUNTER — Other Ambulatory Visit (HOSPITAL_COMMUNITY)
Admission: RE | Admit: 2024-04-21 | Discharge: 2024-04-21 | Disposition: A | Discharge status: Home / Self Care | Source: Ambulatory Visit | Attending: Nurse Practitioner | Admitting: Nurse Practitioner

## 2024-04-21 VITALS — BP 116/88 | HR 77 | Temp 97.4°F | Ht 67.32 in | Wt 335.6 lb

## 2024-04-21 DIAGNOSIS — M25551 Pain in right hip: Secondary | ICD-10-CM

## 2024-04-21 DIAGNOSIS — Z113 Encounter for screening for infections with a predominantly sexual mode of transmission: Secondary | ICD-10-CM

## 2024-04-21 DIAGNOSIS — R7303 Prediabetes: Secondary | ICD-10-CM

## 2024-04-21 DIAGNOSIS — Z0000 Encounter for general adult medical examination without abnormal findings: Secondary | ICD-10-CM

## 2024-04-21 DIAGNOSIS — G4733 Obstructive sleep apnea (adult) (pediatric): Secondary | ICD-10-CM | POA: Diagnosis not present

## 2024-04-21 DIAGNOSIS — F5101 Primary insomnia: Secondary | ICD-10-CM

## 2024-04-21 DIAGNOSIS — E559 Vitamin D deficiency, unspecified: Secondary | ICD-10-CM | POA: Diagnosis not present

## 2024-04-21 MED ORDER — MELOXICAM 15 MG PO TABS: 15.0000 mg | ORAL_TABLET | Freq: Every day | ORAL | 1 refills | Status: AC

## 2024-04-21 NOTE — Assessment & Plan Note (Signed)
Health maintenance reviewed and updated. Discussed nutrition, exercise. Check CMP, CBC, TSH today. Follow-up 1 year.   

## 2024-04-21 NOTE — Assessment & Plan Note (Signed)
 Chronic, stable. Continue meloxicam  15mg  daily with food as needed for pain.

## 2024-04-21 NOTE — Patient Instructions (Signed)
It was great to see you!  We are checking your labs today and will let you know the results via mychart/phone.   Let's follow-up in 1 year, sooner if you have concerns.  If a referral was placed today, you will be contacted for an appointment. Please note that routine referrals can sometimes take up to 3-4 weeks to process. Please call our office if you haven't heard anything after this time frame.  Take care,  Naina Sleeper, NP  

## 2024-04-21 NOTE — Assessment & Plan Note (Signed)
 Chronic, stable. Check BMP, CBC, A1c today and treat based on results.

## 2024-04-21 NOTE — Assessment & Plan Note (Signed)
 Chronic, stable. Continue magnesium supplement at bedtime as needed.

## 2024-04-21 NOTE — Assessment & Plan Note (Signed)
 BMI 52.  Discussed nutrition, exercise.

## 2024-04-21 NOTE — Assessment & Plan Note (Signed)
 Chronic, stable. Well controlled with CPAP. Continue following with sleep medicine.

## 2024-04-21 NOTE — Progress Notes (Signed)
 BP 116/88 (BP Location: Right Wrist, Patient Position: Sitting, Cuff Size: Large)   Pulse 77   Temp (!) 97.4 F (36.3 C)   Ht 5' 7.32" (1.71 m)   Wt (!) 335 lb 9.6 oz (152.2 kg)   SpO2 97%   BMI 52.06 kg/m    Subjective:    Patient ID: Brooke Dean, female    DOB: 07/26/1991, 33 y.o.   MRN: [id number]  CC: Chief Complaint  Patient presents with   Annual Exam    With fasting lab work, no concerns    HPI: Brooke Dean is a 33 y.o. female presenting on 04/21/2024 for comprehensive medical examination. Current medical complaints include:none  She currently lives with: siblings Menopausal Symptoms: no  Depression and Anxiety Screen done today and results listed below:     04/21/2024    2:42 PM 04/21/2023    3:06 PM 01/20/2023    3:49 PM 10/21/2022    2:27 PM 10/21/2022    1:19 PM  Depression screen PHQ 2/9  Decreased Interest 1 0 0 1 0  Down, Depressed, Hopeless 1 2 1 2 2   PHQ - 2 Score 2 2 1 3 2   Altered sleeping 1 2 3 1    Tired, decreased energy 1 1 1 1    Change in appetite 0 0 0 0   Feeling bad or failure about yourself  1 0 0 1   Trouble concentrating 0 0 0 0   Moving slowly or fidgety/restless 0 0 1 0   Suicidal thoughts 0 0 0 0   PHQ-9 Score 5 5 6 6    Difficult doing work/chores  Somewhat difficult Somewhat difficult Somewhat difficult       04/21/2024    2:42 PM 04/21/2023    3:07 PM 01/20/2023    3:49 PM 10/21/2022    2:29 PM  GAD 7 : Generalized Anxiety Score  Nervous, Anxious, on Edge 0 1 1 1   Control/stop worrying 0 0 0 1  Worry too much - different things 0 0 0 1  Trouble relaxing 0 1 1 1   Restless 0 0 1 0  Easily annoyed or irritable 0 0 0 0  Afraid - awful might happen 0 0 0 0  Total GAD 7 Score 0 2 3 4   Anxiety Difficulty  Somewhat difficult Somewhat difficult Somewhat difficult    The patient does not have a history of falls. I did not complete a risk assessment for falls. A plan of care for falls was not documented.   Past Medical  History:  Past Medical History:  Diagnosis Date   Anxiety    Depression     Surgical History:  Past Surgical History:  Procedure Laterality Date   ADENOIDECTOMY Bilateral    33yo   TONSILLECTOMY AND ADENOIDECTOMY     WISDOM TOOTH EXTRACTION      Medications:  Current Outpatient Medications on File Prior to Visit  Medication Sig   azelastine  (ASTELIN ) 0.1 % nasal spray Place 2 sprays into both nostrils 2 (two) times daily as needed for rhinitis or allergies. Use in each nostril as directed   cetirizine  (ZYRTEC  ALLERGY ) 10 MG tablet Take 1 tablet (10 mg total) by mouth daily.   etonogestrel-ethinyl estradiol (NUVARING) 0.12-0.015 MG/24HR vaginal ring Place vaginally.   fluticasone  (FLONASE ) 50 MCG/ACT nasal spray Place 2 sprays into both nostrils daily.   UNABLE TO FIND Med Name: CPAP machine Nightly   No current facility-administered medications on file prior to visit.  Allergies:  Allergies  Allergen Reactions   Other Swelling    Spider Bites-Swelling    Social History:  Social History   Socioeconomic History   Marital status: Single    Spouse name: Not on file   Number of children: Not on file   Years of education: Not on file   Highest education level: Not on file  Occupational History   Not on file  Tobacco Use   Smoking status: Never    Passive exposure: Past   Smokeless tobacco: Never  Vaping Use   Vaping status: Never Used  Substance and Sexual Activity   Alcohol use: Not Currently    Comment: rare   Drug use: Not Currently    Comment: thc gummies   Sexual activity: Not Currently    Birth control/protection: Inserts  Other Topics Concern   Not on file  Social History Narrative   Not on file   Social Drivers of Health   Financial Resource Strain: Not on file  Food Insecurity: Unknown (06/06/2021)   Received from [facility]   Hunger Vital Sign    Worried About Running Out of Food in the Last Year: Patient declined    Ran  Out of Food in the Last Year: Patient declined  Transportation Needs: Not on file  Physical Activity: Not on file  Stress: Not on file  Social Connections: Unknown (04/02/2022)   Received from [facility]   Social Network    Social Network: Not on file  Intimate Partner Violence: Unknown (02/22/2022)   Received from [employer], [facility]   HITS    Physically Hurt: Not on file    Insult or Talk Down To: Not on file    Threaten Physical Harm: Not on file    Scream or Curse: Not on file   Social History   Tobacco Use  Smoking Status Never   Passive exposure: Past  Smokeless Tobacco Never   Social History   Substance and Sexual Activity  Alcohol Use Not Currently   Comment: rare    Family History:  Family History  Problem Relation Age of Onset   Lymphoma Mother    Edema Mother    Allergic rhinitis Father    Heart failure Father    Sleep apnea Father    Eczema Sister    Other Sister    Allergic rhinitis Brother    Allergic rhinitis Brother    Cancer Maternal Grandfather     Past medical history, surgical history, medications, allergies, family history and social history reviewed with patient today and changes made to appropriate areas of the chart.   Review of Systems  Constitutional: Negative.   HENT: Negative.    Eyes: Negative.   Respiratory: Negative.    Cardiovascular: Negative.   Gastrointestinal: Negative.   Genitourinary: Negative.   Musculoskeletal: Negative.   Skin: Negative.   Neurological: Negative.   Psychiatric/Behavioral: Negative.     All other ROS negative except what is listed above and in the HPI.      Objective:     BP 116/88 (BP Location: Right Wrist, Patient Position: Sitting, Cuff Size: Large)   Pulse 77   Temp (!) 97.4 F (36.3 C)   Ht 5' 7.32" (1.71 m)   Wt (!) 335 lb 9.6 oz (152.2 kg)   SpO2 97%   BMI 52.06 kg/m   Wt Readings from Last 3 Encounters:  04/21/24 (!) 335 lb 9.6 oz (152.2 kg)  01/30/24  Aaron Aas)  341 lb 1.6 oz (154.7 kg)  10/22/23 (!) 332 lb (150.6 kg)    Physical Exam Vitals and nursing note reviewed.  Constitutional:      General: She is not in acute distress.    Appearance: Normal appearance. She is obese.  HENT:     Head: Normocephalic and atraumatic.     Right Ear: Tympanic membrane, ear canal and external ear normal.     Left Ear: Tympanic membrane, ear canal and external ear normal.     Mouth/Throat:     Mouth: Mucous membranes are moist.     Pharynx: No posterior oropharyngeal erythema.  Eyes:     Conjunctiva/sclera: Conjunctivae normal.  Cardiovascular:     Rate and Rhythm: Normal rate and regular rhythm.     Pulses: Normal pulses.     Heart sounds: Normal heart sounds.  Pulmonary:     Effort: Pulmonary effort is normal.     Breath sounds: Normal breath sounds.  Abdominal:     Palpations: Abdomen is soft.     Tenderness: There is no abdominal tenderness.  Musculoskeletal:        General: Normal range of motion.     Cervical back: Normal range of motion and neck supple.     Right lower leg: No edema.     Left lower leg: No edema.  Lymphadenopathy:     Cervical: No cervical adenopathy.  Skin:    General: Skin is warm and dry.  Neurological:     General: No focal deficit present.     Mental Status: She is alert and oriented to person, place, and time.     Cranial Nerves: No cranial nerve deficit.     Coordination: Coordination normal.     Gait: Gait normal.  Psychiatric:        Mood and Affect: Mood normal.        Behavior: Behavior normal.        Thought Content: Thought content normal.        Judgment: Judgment normal.     Results for orders placed or performed in visit on 10/22/23  Basic metabolic panel   Collection Time: 10/22/23  2:15 PM  Result Value Ref Range   Sodium 138 135 - 145 mEq/L   Potassium 4.2 3.5 - 5.1 mEq/L   Chloride 106 96 - 112 mEq/L   CO2 26 19 - 32 mEq/L   Glucose, Bld 102 (H) 70 - 99 mg/dL   BUN 11 6 - 23 mg/dL    Creatinine, Ser 1.61 0.40 - 1.20 mg/dL   GFR 09.60 >45.40 mL/min   Calcium 9.5 8.4 - 10.5 mg/dL  CBC with Differential/Platelet   Collection Time: 10/22/23  2:15 PM  Result Value Ref Range   WBC 7.4 4.0 - 10.5 K/uL   RBC 4.31 3.87 - 5.11 Mil/uL   Hemoglobin 13.4 12.0 - 15.0 g/dL   HCT 98.1 19.1 - 47.8 %   MCV 92.6 78.0 - 100.0 fl   MCHC 33.6 30.0 - 36.0 g/dL   RDW 29.5 62.1 - 30.8 %   Platelets 306.0 150.0 - 400.0 K/uL   Neutrophils Relative % 43.3 43.0 - 77.0 %   Lymphocytes Relative 47.7 (H) 12.0 - 46.0 %   Monocytes Relative 6.9 3.0 - 12.0 %   Eosinophils Relative 1.6 0.0 - 5.0 %   Basophils Relative 0.5 0.0 - 3.0 %   Neutro Abs 3.2 1.4 - 7.7 K/uL   Lymphs Abs 3.5 0.7 - 4.0 K/uL  Monocytes Absolute 0.5 0.1 - 1.0 K/uL   Eosinophils Absolute 0.1 0.0 - 0.7 K/uL   Basophils Absolute 0.0 0.0 - 0.1 K/uL  Hemoglobin A1c   Collection Time: 10/22/23  2:15 PM  Result Value Ref Range   Hgb A1c MFr Bld 6.3 4.6 - 6.5 %  VITAMIN D  25 Hydroxy (Vit-D Deficiency, Fractures)   Collection Time: 10/22/23  2:15 PM  Result Value Ref Range   VITD 18.85 (L) 30.00 - 100.00 ng/mL      Assessment & Plan:   Problem List Items Addressed This Visit       Respiratory   OSA (obstructive sleep apnea)   Chronic, stable. Well controlled with CPAP. Continue following with sleep medicine.         Other   Right hip pain   Chronic, stable. Continue meloxicam  15mg  daily with food as needed for pain.       Morbid obesity (HCC)   BMI 52.  Discussed nutrition, exercise.       Prediabetes   Chronic, stable. Check BMP, CBC, A1c today and treat based on results.       Relevant Orders   CBC with Differential/Platelet   Comprehensive metabolic panel with GFR   Hemoglobin A1c   Vitamin D  deficiency   Chronic, stable. Check vitamin D  levels today and treat based on results. She just got a supplement to start taking.       Relevant Orders   VITAMIN D  25 Hydroxy (Vit-D Deficiency, Fractures)    Routine general medical examination at a health care facility - Primary   Health maintenance reviewed and updated. Discussed nutrition, exercise. Check CMP, CBC, TSH today. Follow-up 1 year.         Relevant Orders   TSH   Primary insomnia   Chronic, stable. Continue magnesium supplement at bedtime as needed.       Other Visit Diagnoses       Screen for STD (sexually transmitted disease)       Screen STD per patient request.   Relevant Orders   HIV Antibody (routine testing w rflx)   Urine cytology ancillary only        Follow up plan: Return in about 1 year (around 04/21/2025) for CPE.   LABORATORY TESTING:  - Pap smear: up to date  IMMUNIZATIONS:   - Tdap: Tetanus vaccination status reviewed: last tetanus booster within 10 years. - Influenza: Postponed to flu season - Pneumovax: Not applicable - Prevnar: Not applicable - HPV: Not applicable - Shingrix vaccine: Not applicable  SCREENING: -Mammogram: Not applicable  - Colonoscopy: Not applicable  - Bone Density: Not applicable   PATIENT COUNSELING:   Advised to take 1 mg of folate supplement per day if capable of pregnancy.   Sexuality: Discussed sexually transmitted diseases, partner selection, use of condoms, avoidance of unintended pregnancy  and contraceptive alternatives.   Advised to avoid cigarette smoking.  I discussed with the patient that most people either abstain from alcohol or drink within safe limits (<=14/week and <=4 drinks/occasion for males, <=7/weeks and <= 3 drinks/occasion for females) and that the risk for alcohol disorders and other health effects rises proportionally with the number of drinks per week and how often a drinker exceeds daily limits.  Discussed cessation/primary prevention of drug use and availability of treatment for abuse.   Diet: Encouraged to adjust caloric intake to maintain  or achieve ideal body weight, to reduce intake of dietary saturated fat and total fat, to limit  sodium intake by avoiding high sodium foods and not adding table salt, and to maintain adequate dietary potassium and calcium preferably from fresh fruits, vegetables, and low-fat dairy products.    stressed the importance of regular exercise  Injury prevention: Discussed safety belts, safety helmets, smoke detector, smoking near bedding or upholstery.   Dental health: Discussed importance of regular tooth brushing, flossing, and dental visits.    NEXT PREVENTATIVE PHYSICAL DUE IN 1 YEAR. Return in about 1 year (around 04/21/2025) for CPE.  Brooke Dean A Obinna Ehresman

## 2024-04-21 NOTE — Assessment & Plan Note (Addendum)
 Chronic, stable. Check vitamin D  levels today and treat based on results. She just got a supplement to start taking.

## 2024-04-22 ENCOUNTER — Ambulatory Visit: Payer: Self-pay | Admitting: Nurse Practitioner

## 2024-04-22 ENCOUNTER — Telehealth: Payer: Self-pay

## 2024-04-22 LAB — CBC WITH DIFFERENTIAL/PLATELET
Basophils Absolute: 0.1 10*3/uL (ref 0.0–0.1)
Basophils Relative: 0.8 % (ref 0.0–3.0)
Eosinophils Absolute: 0.1 10*3/uL (ref 0.0–0.7)
Eosinophils Relative: 2 % (ref 0.0–5.0)
HCT: 39.4 % (ref 36.0–46.0)
Hemoglobin: 13.4 g/dL (ref 12.0–15.0)
Lymphocytes Relative: 51.3 % — ABNORMAL HIGH (ref 12.0–46.0)
Lymphs Abs: 3.7 10*3/uL (ref 0.7–4.0)
MCHC: 34 g/dL (ref 30.0–36.0)
MCV: 89 fl (ref 78.0–100.0)
Monocytes Absolute: 0.5 10*3/uL (ref 0.1–1.0)
Monocytes Relative: 6.6 % (ref 3.0–12.0)
Neutro Abs: 2.8 10*3/uL (ref 1.4–7.7)
Neutrophils Relative %: 39.3 % — ABNORMAL LOW (ref 43.0–77.0)
Platelets: 310 10*3/uL (ref 150.0–400.0)
RBC: 4.42 Mil/uL (ref 3.87–5.11)
RDW: 13.1 % (ref 11.5–15.5)
WBC: 7.3 10*3/uL (ref 4.0–10.5)

## 2024-04-22 LAB — COMPREHENSIVE METABOLIC PANEL WITH GFR
ALT: 18 U/L (ref 0–35)
AST: 28 U/L (ref 0–37)
Albumin: 3.9 g/dL (ref 3.5–5.2)
Alkaline Phosphatase: 63 U/L (ref 39–117)
BUN: 14 mg/dL (ref 6–23)
CO2: 20 meq/L (ref 19–32)
Calcium: 9.5 mg/dL (ref 8.4–10.5)
Chloride: 107 meq/L (ref 96–112)
Creatinine, Ser: 0.91 mg/dL (ref 0.40–1.20)
GFR: 84.48 mL/min (ref >60.00)
Glucose, Bld: 78 mg/dL (ref 70–99)
Potassium: 3.8 meq/L (ref 3.5–5.1)
Sodium: 139 meq/L (ref 135–145)
Total Bilirubin: 0.5 mg/dL (ref 0.2–1.2)
Total Protein: 6.8 g/dL (ref 6.0–8.3)

## 2024-04-22 LAB — HIV ANTIBODY (ROUTINE TESTING W REFLEX): HIV 1&2 Ab, 4th Generation: NONREACTIVE

## 2024-04-22 LAB — TSH: TSH: 2.06 u[IU]/mL (ref 0.35–5.50)

## 2024-04-22 LAB — VITAMIN D 25 HYDROXY (VIT D DEFICIENCY, FRACTURES): VITD: 27.98 ng/mL — ABNORMAL LOW (ref 30.00–100.00)

## 2024-04-22 LAB — HEMOGLOBIN A1C: Hgb A1c MFr Bld: 6.3 % (ref 4.6–6.5)

## 2024-04-22 MED ORDER — KETOCONAZOLE 2 % EX SHAM: 1.0000 | MEDICATED_SHAMPOO | CUTANEOUS | 0 refills | Status: DC

## 2024-04-22 NOTE — Telephone Encounter (Signed)
 Patient notified that request Rx sent to pharmacy.

## 2024-04-22 NOTE — Telephone Encounter (Signed)
 Copied from CRM [id number]. Topic: Clinical - Medication Question >> Apr 22, 2024  9:23 AM Adonis Hoot wrote: Reason for CRM: Patient called in to ask provider if she could prescribe 2% Ketoconazole shampoo? She forgot to mention to provider at her visit on 04/21/2024. She was originally prescribed this shampoo by a different provider for her dermatitis  [id number] - [place], [place] - [address] AT [facility] [address]  Phone: [phone] Fax: [phone]

## 2024-04-23 LAB — URINE CYTOLOGY ANCILLARY ONLY
Chlamydia: NEGATIVE
Comment: NEGATIVE
Comment: NORMAL
Neisseria Gonorrhea: NEGATIVE

## 2024-05-03 NOTE — Progress Notes (Unsigned)
 Brooke Dean

## 2024-05-04 ENCOUNTER — Ambulatory Visit (INDEPENDENT_AMBULATORY_CARE_PROVIDER_SITE_OTHER): Payer: BC Managed Care – PPO | Admitting: Adult Health

## 2024-05-04 ENCOUNTER — Encounter: Payer: Self-pay | Admitting: Adult Health

## 2024-05-04 VITALS — BP 140/85 | HR 67 | Ht 68.0 in | Wt 339.0 lb

## 2024-05-04 DIAGNOSIS — G4733 Obstructive sleep apnea (adult) (pediatric): Secondary | ICD-10-CM

## 2024-05-04 NOTE — Progress Notes (Signed)
 Shakiyla Kook D, CMA  Vergia Glasgow Bradley; Garcia, Patricia; Ziegler, Melissa; Tucker, Dolanda; Cain, Helena New orders have been placed for the above pt, DOB: [birth year] Thanks

## 2024-05-04 NOTE — Patient Instructions (Signed)
 Your Plan:  Continue nightly use of CPAP with greater than 4 hours per night for optimal benefit and per insurance requirements  Continue to follow with your DME for any needed supplies or CPAP related concerns.  Please be sure to change your filter every month, your mask about every 3 months, hose about every 6 months, humidifier chamber about yearly. Some restrictions are imposed by your insurance carrier with regard to how frequently you can get certain supplies. Your DME company can provide further details if necessary.      Follow-up in 1 year or call earlier if needed     Thank you for coming to see us  at [facility]. I hope we have been able to provide you high quality care today.  You may receive a patient satisfaction survey over the next few weeks. We would appreciate your feedback and comments so that we may continue to improve ourselves and the health of our patients.

## 2024-05-25 ENCOUNTER — Ambulatory Visit: Payer: Self-pay

## 2024-05-25 NOTE — Telephone Encounter (Signed)
 Patient is experiencing liquid discharge out of her rectum when she uses the bathroom inquiring what to do been ongoing since yesterday [phone]   Reason for Disposition . MILD-MODERATE diarrhea (e.g., 1-6 times / day more than normal)  Answer Assessment - Initial Assessment Questions 1. DIARRHEA SEVERITY: How bad is the diarrhea? How many more stools have you had in the past 24 hours than normal?    - NO DIARRHEA (SCALE 0)   - MILD (SCALE 1-3): Few loose or mushy BMs; increase of 1-3 stools over normal daily number of stools; mild increase in ostomy output.   -  MODERATE (SCALE 4-7): Increase of 4-6 stools daily over normal; moderate increase in ostomy output.   -  SEVERE (SCALE 8-10; OR WORST POSSIBLE): Increase of 7 or more stools daily over normal; moderate increase in ostomy output; incontinence.     4+ 2. ONSET: When did the diarrhea begin?      yesterday 3. BM CONSISTENCY: How loose or watery is the diarrhea?      watery 4. VOMITING: Are you also vomiting? If Yes, ask: How many times in the past 24 hours?      denies 5. ABDOMEN PAIN: Are you having any abdomen pain? If Yes, ask: What does it feel like? (e.g., crampy, dull, intermittent, constant)      denies 6. ABDOMEN PAIN SEVERITY: If present, ask: How bad is the pain?  (e.g., Scale 1-10; mild, moderate, or severe)   - MILD (1-3): doesn't interfere with normal activities, abdomen soft and not tender to touch    - MODERATE (4-7): interferes with normal activities or awakens from sleep, abdomen tender to touch    - SEVERE (8-10): excruciating pain, doubled over, unable to do any normal activities       denies 7. ORAL INTAKE: If vomiting, Have you been able to drink liquids? How much liquids have you had in the past 24 hours?     Lemonade; water 8. HYDRATION: Any signs of dehydration? (e.g., dry mouth [not just dry lips], too weak to stand, dizziness, new weight loss) When did you last urinate?      denies 10. ANTIBIOTIC USE: Are you taking antibiotics now or have you taken antibiotics in the past 2 months?       denies 11. OTHER SYMPTOMS: Do you have any other symptoms? (e.g., fever, blood in stool)       Denies    Pt has been able to keep some food and liquid in. Pt denied any signs of dehydration. RN gave home care advice and told to call back if symptoms worsen or don't subside. Pt verbalized understanding.  Protocols used: [facility]

## 2024-05-25 NOTE — Telephone Encounter (Signed)
 FYI Only or Action Required?: FYI only for provider.  Patient was last seen in primary care on 04/21/2024 by Nedra Tinnie LABOR, NP.  Called Nurse Triage reporting Diarrhea.  Symptoms began yesterday.  Interventions attempted: Nothing.  Symptoms are: stable.  Triage Disposition: Home Care  Patient/caregiver understands and will follow disposition?: Yes

## 2024-08-19 LAB — OB RESULTS CONSOLE GC/CHLAMYDIA: Chlamydia: NEGATIVE

## 2024-08-19 LAB — IRON,TIBC AND FERRITIN PANEL
%SAT: 26
Ferritin: 123
Iron: 101
TIBC: 389
UIBC: 288

## 2024-08-19 LAB — HM HIV SCREENING LAB: HM HIV Screening: NEGATIVE

## 2024-08-19 LAB — CBC: RBC: 4.36 (ref 3.87–5.11)

## 2024-08-19 LAB — CBC AND DIFFERENTIAL
HCT: 40 (ref 36–46)
Hemoglobin: 13.7 (ref 12.0–16.0)
Platelets: 349 K/uL (ref 150–400)
WBC: 7.2

## 2024-08-20 ENCOUNTER — Telehealth: Payer: Self-pay

## 2024-08-20 NOTE — Telephone Encounter (Signed)
 Patient would like to know if she should have her results faxed from her OBGYN annual due to Iron & CRP. Stated CRP was slightly elevated & Iron saturation level was low also B12 low, stated says normal range but on scale looks low. Would like to know if she could take B12 vitamins as well? Please advise

## 2024-08-24 ENCOUNTER — Encounter: Payer: Self-pay | Admitting: Nurse Practitioner

## 2024-08-25 NOTE — Telephone Encounter (Signed)
 Contacted patient to inform her that she does not need the supplements and she will like to know have you taken a look at her HSCRP

## 2024-08-30 ENCOUNTER — Encounter: Payer: Self-pay | Admitting: Nurse Practitioner

## 2024-09-09 ENCOUNTER — Other Ambulatory Visit: Payer: Self-pay | Admitting: Nurse Practitioner

## 2024-09-09 NOTE — Telephone Encounter (Signed)
 Requesting: ketoconazole  (NIZORAL ) 2 % shampoo Last Visit: 04/21/2024 Next Visit: Visit date not found Last Refill: 04/22/2024  Please Advise

## 2025-01-28 ENCOUNTER — Ambulatory Visit: Admitting: Allergy

## 2025-01-28 ENCOUNTER — Ambulatory Visit: Admitting: Internal Medicine

## 2025-04-27 ENCOUNTER — Ambulatory Visit: Admitting: Nurse Practitioner

## 2025-05-04 ENCOUNTER — Ambulatory Visit: Admitting: Adult Health
